# Patient Record
Sex: Female | Born: 2000 | Race: White | Hispanic: No | Marital: Single | Smoking: Never smoker
Health system: Southern US, Community
[De-identification: ages and names within clinical notes are randomized; demographics above are authoritative.]

## PROBLEM LIST (undated history)

## (undated) DIAGNOSIS — G43909 Migraine, unspecified, not intractable, without status migrainosus: Secondary | ICD-10-CM

## (undated) DIAGNOSIS — J45909 Unspecified asthma, uncomplicated: Secondary | ICD-10-CM

## (undated) HISTORY — PX: SHOULDER ARTHROSCOPY: SHX128

---

## 2019-07-27 ENCOUNTER — Telehealth
Admit: 2019-07-27 | Payer: PRIVATE HEALTH INSURANCE | Attending: Obstetrics and Gynecology | Primary: Adolescent Medicine

## 2019-07-27 MED ORDER — NORETHINDRONE (CONTRACEPTIVE) 0.35 MG TABLET
0.35 mg | ORAL_TABLET | Freq: Every day | ORAL | 3 refills | Status: AC
Start: 2019-07-27 — End: 2020-02-14

## 2019-07-27 NOTE — Telephone Encounter
BLMMargaret called stating her Sylnd is no longer covered by her insurance.  She states she called her insurance company and they did not have anything comparable.  She is requesting an alternative.  Pharmacy verified.[facility] [phone]

## 2019-07-27 NOTE — Telephone Encounter
Leah Lyons - please call her; micronor is close.  Have her try that.  I sent rx.

## 2019-07-28 NOTE — Telephone Encounter
Pt advised.

## 2019-12-24 ENCOUNTER — Telehealth: Admit: 2019-12-24 | Payer: PRIVATE HEALTH INSURANCE | Attending: Family Medicine | Primary: Adolescent Medicine

## 2019-12-24 NOTE — Telephone Encounter
Patient called in requesting a appointment with Dr. Juliet Rude.Offered patient the next available face to face appointment of 9/8 and tele health appointment of 9/10 in which patient declined both.Patient stated she needs a appointment before she goes back to school on 8/10.Patient # [phone]

## 2019-12-28 NOTE — Telephone Encounter
Left message for patient to call back and schedule.Please schedule patient in sooner appointment with Sharen Counter per [employer] message.

## 2020-01-05 ENCOUNTER — Encounter: Admit: 2020-01-05 | Payer: BLUE CROSS/BLUE SHIELD | Attending: Family Medicine | Primary: Adolescent Medicine

## 2020-01-06 ENCOUNTER — Telehealth
Admit: 2020-01-06 | Payer: PRIVATE HEALTH INSURANCE | Attending: Obstetrics and Gynecology | Primary: Adolescent Medicine

## 2020-01-06 ENCOUNTER — Encounter: Admit: 2020-01-06 | Payer: BLUE CROSS/BLUE SHIELD | Attending: Family Medicine | Primary: Adolescent Medicine

## 2020-01-06 DIAGNOSIS — G43009 Migraine without aura, not intractable, without status migrainosus: Secondary | ICD-10-CM

## 2020-01-06 MED ORDER — LAMOTRIGINE IMMEDIATE RELEASE 100 MG TABLET
100 mg | Freq: Every day | ORAL | Status: AC
Start: 2020-01-06 — End: 2021-10-05

## 2020-01-06 MED ORDER — NERIVIO DEVICE
13 refills | Status: AC
Start: 2020-01-06 — End: ?

## 2020-01-06 MED ORDER — DIVALPROEX 500 MG TABLET,DELAYED RELEASE
500 mg | Freq: Three times a day (TID) | ORAL | Status: AC
Start: 2020-01-06 — End: 2020-06-05

## 2020-01-06 MED ORDER — DIVALPROEX 250 MG TABLET,DELAYED RELEASE
250 mg | Freq: Three times a day (TID) | ORAL | Status: AC
Start: 2020-01-06 — End: 2020-06-05

## 2020-01-06 MED ORDER — GALCANEZUMAB-GNLM 120 MG/ML SUBCUTANEOUS PEN INJECTOR
120 mg/mL | SUBCUTANEOUS | 11 refills | Status: AC
Start: 2020-01-06 — End: 2021-10-05

## 2020-01-06 MED ORDER — GALCANEZUMAB-GNLM 120 MG/ML SUBCUTANEOUS PEN INJECTOR
120 mg/mL | Freq: Once | SUBCUTANEOUS | 1 refills | Status: AC
Start: 2020-01-06 — End: ?

## 2020-01-06 MED ORDER — ONDANSETRON HCL 4 MG TABLET
4 mg | ORAL_TABLET | Freq: Three times a day (TID) | ORAL | 6 refills | Status: AC | PRN
Start: 2020-01-06 — End: ?

## 2020-01-06 NOTE — Telephone Encounter
BLM,Leah Lyons is calling because the past couple of months she has had migraines near her period, they are worse and lasting longer than in the past. She can be reached at [phone]

## 2020-01-06 NOTE — Progress Notes
The following is the transcribed Review of Systems, done by the patient at Intake, and attached to this is the attending physician's documentation.Review of Systems Constitutional: Negative.  HENT: Negative.  Eyes: Negative.  Respiratory: Negative.  Cardiovascular: Negative.  Gastrointestinal: Negative.  Genitourinary: Negative.  Musculoskeletal: Negative.  Skin: Negative.  Neurological: Positive for dizziness and headaches. Endo/Heme/Allergies: Negative.  Psychiatric/Behavioral: The patient is nervous/anxious.

## 2020-01-06 NOTE — Progress Notes
VIDEO TELEHEALTH VISIT: This clinician is part of the telehealth program and is conducting this visit in a currently approved location. For this visit the clinician and patient were present via interactive audio & video telecommunications system that permits real-time communications.Consent was signed via the Patient Acknowledgement and Financial Authorization Form and the Ambulatory Telehealth Consent Form. Patient is located in [place] state.The clinician is appropriately licensed in the above state to provide care for this visit. Other individuals present during the telehealth encounter and their role/relation: noneIf billing based on time, please complete (Not required if billing based on MDM):                           Total time spent in medical video consultation: 28 Total time spent by the provider on the day of service, which includes time spent on chart review, medical video consultation, education, coordination of care/services and counseling 45 Because this visit was completed over video, a hands-on physical exam was not performed.  Patient/parent or guardian understands and knows to call back if condition changes.Interval Hx: ?Leah Lyons is a pleasant 19 year old woman who presents today for a follow up of headaches. She was last seen on 06/04/2019 under the care of Dr. Juliet Lyons. She recently presented to the [facility] ED on 12/19/2019 for intractable headache.?Today, she states her headaches have gotten much worse since her last visit. She has had 4 severe migraines in the last month, one prompting her to seek care in the emergency department. She states earlier this month she was started on Depakote 750mg  by her psychiatrist for bipolar disorder. She also notes she started a new OCP in January 2021 (northedrone) and has noticed she is now having periods twice a month. She endorses not having a full blown period but has spotting with all associated menstrual cycle symptoms two times per month.  Leah Lyons states her parents want her to have a thyroid panel and hormone panel completed. Her mother has thyroid disease (she is unsure what kind) and thyroid issues run in her family. She reports being more fatigued than usual lately and is concerned regarding thyroid function.She is using Migranal spray at onset of headache with relief obtained quickly, but only lasting 30 minutes before headache returns more severely. She additionally rotates using Tylenol and Advil nothing really knocks it out. She also notes she is now having vertigo and increased nausea with headaches. She has tried Reglan previously with little relief and states Zofran relieves nausea more effectively for her.Leah Lyons is no longer using Emgality, she feels it did not work but also states she forgot about it when going to school and only used it for possibly 2 months total. She last injected this medication about one year ago. She is using Magnesium and Riboflavin and is taking 250mg  of each.Past failed meds: Sumatriptan,  Nortriptyline, Amitriptyline, Gabapentin, Propranolol, ??Current Headache Medications:?Preventives:-Emgality 120mg ?Abortives:-Migranal 4mg  NS?   Allergies Allergen Reactions ? Latex Rash ???     Current Outpatient Medications on File Prior to Visit Medication Sig Dispense Refill ? dihydroergotamine (MIGRANAL) 0.5 mg/pump act. (4 mg/mL) nasal spray Use 1 spray in each nostril as needed for migraine. Use in one nostril as directed.  No more than 4 sprays in one hour 8 mL 12 ? galcanezumab-gnlm (EMGALITY) 120 mg/mL syringe Inject 1 mL (120 mg total) under the skin every 28 days. 1 mL 10 ? norethindrone (MICRONOR) 0.35 mg tablet Take 1 tablet (0.35 mg total) by mouth  daily. 84 tablet 2 ? sertraline (ZOLOFT) 100 mg tablet Take 100 mg by mouth daily. ? ? ?No current facility-administered medications on file prior to visit.  ?Past Medical History    Past Medical History: Diagnosis Date ? Acne ? ? Asthma ? ? Headache ? ? Migraines ?  ???PHYSICAL EXAM:No physical exam was performed today due to video nature of this encounter. ?Wt Readings from Last 3 Encounters: 05/25/19 65.8 kg (76 %, Z= 0.70)* 12/31/18 65.3 kg (76 %, Z= 0.70)* 12/15/18 65.8 kg (77 %, Z= 0.74)* * Growth percentiles are based on CDC (Girls, 2-20 Years) data. Temp Readings from Last 3 Encounters: 12/31/18 98.7 ?F (37.1 ?C) (Temporal) 03/11/18 98.6 ?F (37 ?C) (Temporal) 10/27/17 97.5 ?F (36.4 ?C) (Temporal) BP Readings from Last 3 Encounters: 12/31/18 110/70 12/15/18 118/84 12/01/18 104/70 Pulse Readings from Last 3 Encounters: 12/15/18 76 12/01/18 64 03/11/18 72 ?NEURO EXAM per video observation:Mental Status:Affect is appropriate, Alert, interactive and attentiveOriented to person, place, time and situationMemory recent and remote -intactLanguage fluent with no dysarthria or aphasiaSpeech is clear, not dyarthric.  Attention span  wnl for ageConcentration wnl for ageVII:symmetric brow and smile, no drooping          Tremor: absentAssessment & Plan:?1. 1. Migraine without aura and without status migrainosus, not intractable   ?PLAN:?1. Restart Emgality - take this for a minimum of 4 months to allow medication to reach full benefit2. Nerivio at onset of headache.3. Increase Magnesium/Riboflavin to a therapeutic dose - 400mg  each. 3. Utilize infusion clinic when needed before proceeding to ED4. Zofran 4mg 5. Folic acid 1,06mcg daily - patient reports being sexually active and is currently taking 75mg  Depakote daily. Patient is a female of child-bearing age. At today's visit she has been counseled on the importance of discontinuing AED (anti-epileptic) medication Depakote under physician or APRN supervision if she has plans for pregnancy. If she should become pregnant while on this medication it may be necessary to stop this medication. She has also been counseled to take 1,052mcg (1mg ) folic acid supplementation daily while taking this medication and has verbalized an understanding. The unintended pregnancy rate for women age 29-44 in the [place] is 45-50% and all women of child-bearing age are encouraged to take folic acid supplementation.6. TSH w/reflex is being ordered today7. Follow up with OBGYN regarding irregular menstrual cycles8. RTC 3 monthsLifestyle modifications:Sleep 8 hours per nightDrink at least 8 glasses of water per dayLimit coffee/caffeine to 1-2 cups per dayEat regular mealsExercise daily ?-Patient has been educated to maintain their headache diary.  (I advised Curelator for Headache, an app designed to identify triggers and premonitory signs of migraine). There is also iHeadache, an iPhone app, available as an online service <www.iheadache.com>; for the Android system, there is Headache tracker Pro and Migraine Buddy). -Patient has been counseled to call with any questions, concerns, or new symptoms that arise before the next scheduled appointment and has verbalized and understanding.-I have reviewed the most common and severe side effects of the above medications and the patient understands and agrees to take them.-Patient has been advised to follow up in 4-6 months.-Patient has been educated on the availability of services provided in the infusion clinic and encouraged to call when in need.-Please keep in mind that preventive medications may take 4-12 weeks to be effective in reducing the frequency of your headaches. -Patient has been counseled to call with any concerns or new symptoms that arise before the next appointment and verbalized an understanding. -If your medication does not seem  to be working or if you are experiencing side effects please call the office Monday through Friday 8 AM to 5PM (calling early in the day is best) and we can discuss.??I spent 45 minutes in this visit with 28  minutes dedicated to face to face patient counseling. ?Sharen Counter, APRN ??

## 2020-01-07 NOTE — Telephone Encounter
Pt calls with concern for ramp up in migraines since she came home from school.Wonders if it is related to menses.  Has been on micronor for ~ 6 months and all was ok, but now has uptick in migraines since she has been home.  Was going to ER 1x/mo but now has been 4x/month.Neuro has her on a new cocktail of meds and seems to be working better as of today, but wants to know if there is a hormonal test she can take to help sort this out.  She does not identify any stressors at home that are different from college.Advised :  Ovaries are likely fairly well suppressed on POP if she is being compliant.  She cannot take estrogen containing pills (aura). Depo provera may supporess even more but it is a IM drug and side effects will have to be tolerated if they occur x 12 wks.Will d/w her neurologist next week and get back to her with a plan.

## 2020-01-10 ENCOUNTER — Encounter: Admit: 2020-01-10 | Payer: PRIVATE HEALTH INSURANCE | Primary: Adolescent Medicine

## 2020-01-10 NOTE — Telephone Encounter
Left message for Dr. Juliet Rude to return the call to discuss

## 2020-01-11 ENCOUNTER — Encounter: Admit: 2020-01-11 | Payer: PRIVATE HEALTH INSURANCE | Attending: Family Medicine | Primary: Adolescent Medicine

## 2020-01-11 NOTE — Telephone Encounter
Spoke to Sterling, she will also try and get a message to neurologist to get in touch with you.nr

## 2020-01-11 NOTE — Telephone Encounter
Leah Lyons,Leah Lyons is following up with you, wondering if you have gotten to speak with her neurologist.  She can be reached at [phone]

## 2020-01-11 NOTE — Telephone Encounter
Tried - he has not returned my call.Pls let her know I am away for the week, but will try email if he does not return call soon.

## 2020-02-09 ENCOUNTER — Encounter
Admit: 2020-02-09 | Payer: PRIVATE HEALTH INSURANCE | Attending: Obstetrics and Gynecology | Primary: Adolescent Medicine

## 2020-02-14 ENCOUNTER — Encounter
Admit: 2020-02-14 | Payer: PRIVATE HEALTH INSURANCE | Attending: Obstetrics and Gynecology | Primary: Adolescent Medicine

## 2020-02-14 DIAGNOSIS — Z3041 Encounter for surveillance of contraceptive pills: Secondary | ICD-10-CM

## 2020-02-14 MED ORDER — NORETHINDRONE (CONTRACEPTIVE) 0.35 MG TABLET
0.35 mg | ORAL_TABLET | Freq: Every day | ORAL | 3 refills | Status: AC
Start: 2020-02-14 — End: 2020-08-22

## 2020-02-14 NOTE — Telephone Encounter
BLM,Pt has annual 03/29/20 and is requesting a refill on her birth control pills.Script set up for your review and pharmacy has been updated.[phone]

## 2020-03-14 ENCOUNTER — Emergency Department (HOSPITAL_BASED_OUTPATIENT_CLINIC_OR_DEPARTMENT_OTHER)
Admission: EM | Admit: 2020-03-14 | Discharge: 2020-03-14 | Disposition: A | Discharge status: Home / Self Care | Payer: BC Managed Care – PPO | Attending: Emergency Medicine | Admitting: Emergency Medicine

## 2020-03-14 ENCOUNTER — Other Ambulatory Visit: Payer: Self-pay

## 2020-03-14 ENCOUNTER — Encounter (HOSPITAL_BASED_OUTPATIENT_CLINIC_OR_DEPARTMENT_OTHER): Payer: Self-pay | Admitting: *Deleted

## 2020-03-14 DIAGNOSIS — J45909 Unspecified asthma, uncomplicated: Secondary | ICD-10-CM | POA: Diagnosis not present

## 2020-03-14 DIAGNOSIS — G43909 Migraine, unspecified, not intractable, without status migrainosus: Secondary | ICD-10-CM

## 2020-03-14 DIAGNOSIS — R110 Nausea: Secondary | ICD-10-CM | POA: Diagnosis present

## 2020-03-14 HISTORY — DX: Unspecified asthma, uncomplicated: J45.909

## 2020-03-14 HISTORY — DX: Migraine, unspecified, not intractable, without status migrainosus: G43.909

## 2020-03-14 LAB — PREGNANCY, URINE: Preg Test, Ur: NEGATIVE

## 2020-03-14 MED ORDER — DIPHENHYDRAMINE HCL 50 MG/ML IJ SOLN
25.0000 mg | Freq: Once | INTRAMUSCULAR | Status: AC
  Administered 2020-03-14: 25 mg via INTRAVENOUS
  Filled 2020-03-14: qty 1

## 2020-03-14 MED ORDER — KETOROLAC TROMETHAMINE 30 MG/ML IJ SOLN
30.0000 mg | Freq: Once | INTRAMUSCULAR | Status: AC
  Administered 2020-03-14: 30 mg via INTRAVENOUS
  Filled 2020-03-14: qty 1

## 2020-03-14 MED ORDER — DEXAMETHASONE SODIUM PHOSPHATE 10 MG/ML IJ SOLN
10.0000 mg | Freq: Once | INTRAMUSCULAR | Status: AC
  Administered 2020-03-14: 10 mg via INTRAVENOUS
  Filled 2020-03-14: qty 1

## 2020-03-14 MED ORDER — PROCHLORPERAZINE EDISYLATE 10 MG/2ML IJ SOLN
10.0000 mg | Freq: Once | INTRAMUSCULAR | Status: AC
  Administered 2020-03-14: 10 mg via INTRAVENOUS
  Filled 2020-03-14: qty 2

## 2020-03-14 MED ORDER — SODIUM CHLORIDE 0.9 % IV BOLUS
1000.0000 mL | Freq: Once | INTRAVENOUS | Status: AC
  Administered 2020-03-14: 1000 mL via INTRAVENOUS

## 2020-03-14 NOTE — Discharge Instructions (Signed)
Follow up with your neurologist  Return for new or worsening symptoms

## 2020-03-14 NOTE — ED Provider Notes (Signed)
[facility] EMERGENCY DEPARTMENT Provider Note   CSN: [id number] Arrival date & time: 03/14/20  1253    History Chief Complaint  Patient presents with  . Migraine    Helen Williams is a 19 y.o. female with past medical history significant for migraines, asthma who presents for evaluation of migraine x 5 days.  States this feels similar to her prior migraines.  Rates her pain a 9/10.  States [place] neurology [place] of state.  She is here for college.  States was previously on Depakote for her migraines who was taken off of this 2 months ago.  No recent injury or head trauma.  Currently takes magnesium, Emgality, riboflavin.  Rotates Tylenol, Advil, Tylenol for migraine.  Has intermittent dizziness, nausea, photophobia with headaches.  States migraine cocktails "do not really work."  Patient states her pain will go away however will have rebound headache for the next 3 days.  Patient has failed sumatriptan, nortriptyline, amitriptyline, gabapentin, propranolol.  Tried Migranal, 4 mg NS without relief of her headache.  Headache frontal in nature.  No sudden onset thunderclap headache.  Intermittent bilateral blurry vision however no vision changes currently.  No weakness.  No unilateral paresthesias.  Patient states " I feel 3 clicks behind everything."  No facial asymmetry, speech difficulty, diplopia, chest pain, shortness of breath abdominal pain, diarrhea, dysuria, rashes or lesions.  Denies additional aggravating or alleviating factors.  Was recently diagnosis with sinus infection at school. Had negative COVID test. Currently on Abx.  History obtained from patient and past medical records.  No interpreter used.  HPI     Past Medical History:  Diagnosis Date  . Asthma   . Migraines     There are no problems to display for this patient.   Past Surgical History:  Procedure Laterality Date  . SHOULDER ARTHROSCOPY       OB History   No obstetric history on  file.     History reviewed. No pertinent family history.  Social History   Tobacco Use  . Smoking status: Never Smoker  . Smokeless tobacco: Never Used  Vaping Use  . Vaping Use: Some days  Substance Use Topics  . Alcohol use: Yes  . Drug use: Yes    Types: Marijuana    Home Medications Prior to Admission medications   Not on File    Allergies    Latex  Review of Systems   Review of Systems  Constitutional: Negative.   HENT: Negative.   Respiratory: Negative.   Cardiovascular: Negative.   Gastrointestinal: Negative.   Genitourinary: Negative.   Musculoskeletal: Negative.   Skin: Negative.   Neurological: Positive for dizziness (Intermittent) and headaches. Negative for tremors, seizures, syncope, facial asymmetry, speech difficulty, weakness, light-headedness and numbness.  All other systems reviewed and are negative.   Physical Exam Updated Vital Signs BP 113/80 (BP Location: Right Arm)   Pulse (!) 56   Temp 98 F (36.7 C) (Oral)   Resp 18   Ht 5\' 4"  (1.626 m)   Wt 65.8 kg   LMP 02/12/2020   SpO2 99%   BMI 24.89 kg/m   Physical Exam Physical Exam  Constitutional: Pt is oriented to person, place, and time. Pt appears well-developed and well-nourished. No distress.  HENT:  Head: Normocephalic and atraumatic.  Mouth/Throat: Oropharynx is clear and moist.  Eyes: Conjunctivae and EOM are normal. Pupils are equal, round, and reactive to light. No scleral icterus.  No horizontal, vertical or rotational  nystagmus  Neck: Normal range of motion. Neck supple.  Full active and passive ROM without pain No midline or paraspinal tenderness No nuchal rigidity or meningeal signs  Cardiovascular: Normal rate, regular rhythm and intact distal pulses.   Pulmonary/Chest: Effort normal and breath sounds normal. No respiratory distress. Pt has no wheezes. No rales.  Abdominal: Soft. Bowel sounds are normal. There is no tenderness. There is no rebound and no guarding.    Musculoskeletal: Normal range of motion.  Lymphadenopathy:    No cervical adenopathy.  Neurological: Pt. is alert and oriented to person, place, and time. He has normal reflexes. No cranial nerve deficit.  Exhibits normal muscle tone. Coordination normal.  Mental Status:  Alert, oriented, thought content appropriate. Speech fluent without evidence of aphasia. Able to follow 2 step commands without difficulty.  Cranial Nerves:  II:  Peripheral visual fields grossly normal, pupils equal, round, reactive to light III,IV, VI: ptosis not present, extra-ocular motions intact bilaterally  V,VII: smile symmetric, facial light touch sensation equal VIII: hearing grossly normal bilaterally  IX,X: midline uvula rise  XI: bilateral shoulder shrug equal and strong XII: midline tongue extension  Motor:  5/5 in upper and lower extremities bilaterally including strong and equal grip strength and dorsiflexion/plantar flexion Sensory: Pinprick and light touch normal in all extremities.  Deep Tendon Reflexes: 2+ and symmetric  Cerebellar: normal finger-to-nose with bilateral upper extremities Gait: normal gait and balance CV: distal pulses palpable throughout   Skin: Skin is warm and dry. No rash noted. Pt is not diaphoretic.  Psychiatric: Pt has a normal mood and affect. Behavior is normal. Judgment and thought content normal.  Nursing note and vitals reviewed. ED Results / Procedures / Treatments   Labs (all labs ordered are listed, but only abnormal results are displayed) Labs Reviewed  PREGNANCY, URINE    EKG None  Radiology No results found.  Procedures Procedures (including critical care time)  Medications Ordered in ED Medications  ketorolac (TORADOL) 30 MG/ML injection 30 mg (30 mg Intravenous Given 03/14/20 1402)  prochlorperazine (COMPAZINE) injection 10 mg (10 mg Intravenous Given 03/14/20 1357)  diphenhydrAMINE (BENADRYL) injection 25 mg (25 mg Intravenous Given 03/14/20 1359)   dexamethasone (DECADRON) injection 10 mg (10 mg Intravenous Given 03/14/20 1402)  sodium chloride 0.9 % bolus 1,000 mL (1,000 mLs Intravenous New Bag/Given 03/14/20 1356)   ED Course  I have reviewed the triage vital signs and the nursing notes.  Pertinent labs & imaging results that were available during my care of the patient were reviewed by me and considered in my medical decision making (see chart for details).  19 year old with extensive history for migraines, followed by neurology at [facility] presents for evaluation of headache.  Headache x5 days.  She is afebrile, nonseptic, not ill-appearing.  No recent injury or trauma.  She has nonfocal neuro exam without deficits.  Plan pregnancy test, migraine cocktail and reassess.  Patient reassessed.  States headache has significantly improved.  Now rated a 1/10.  She continues have nonfocal neuro exam without deficits.  Symptoms likely patient's recurrent migraine.  I encouraged to follow-up with her neurologist.  She'll return for any worsening symptoms.  Presentation non concerning for CVA, dissection, central cause of HA, SAH, ICH, Meningitis, or temporal arteritis. Pt is afebrile with no focal neuro deficits, nuchal rigidity, or change in vision.   The patient has been appropriately medically screened and/or stabilized in the ED. I have low suspicion for any other  emergent medical condition which would require further screening, evaluation or treatment in the ED or require inpatient management.  Patient is hemodynamically stable and in no acute distress.  Patient able to ambulate in department prior to ED.  Evaluation does not show acute pathology that would require ongoing or additional emergent interventions while in the emergency department or further inpatient treatment.  I have discussed the diagnosis with the patient and answered all questions.  Pain is been managed while in the emergency department and patient has no  further complaints prior to discharge.  Patient is comfortable with plan discussed in room and is stable for discharge at this time.  I have discussed strict return precautions for returning to the emergency department.  Patient was encouraged to follow-up with PCP/specialist refer to at discharge.    MDM Rules/Calculators/A&P                           Final Clinical Impression(s) / ED Diagnoses Final diagnoses:  Migraine without status migrainosus, not intractable, unspecified migraine type    Rx / DC Orders ED Discharge Orders    None       Toluwani Yadav A, PA-C 03/14/20 1503    Virgina Norfolk, DO 03/15/20 0156

## 2020-03-14 NOTE — ED Triage Notes (Signed)
Migraine x 5 days. Hx of same.

## 2020-03-29 ENCOUNTER — Encounter: Admit: 2020-03-29 | Payer: BLUE CROSS/BLUE SHIELD | Attending: Obstetrics and Gynecology | Primary: Adolescent Medicine

## 2020-04-06 ENCOUNTER — Encounter: Admit: 2020-04-06 | Payer: BLUE CROSS/BLUE SHIELD | Attending: Family Medicine | Primary: Adolescent Medicine

## 2020-04-06 ENCOUNTER — Encounter: Admit: 2020-04-06 | Payer: PRIVATE HEALTH INSURANCE | Attending: Family Medicine | Primary: Adolescent Medicine

## 2020-04-06 DIAGNOSIS — G43109 Migraine with aura, not intractable, without status migrainosus: Secondary | ICD-10-CM

## 2020-04-06 DIAGNOSIS — J45909 Unspecified asthma, uncomplicated: Secondary | ICD-10-CM

## 2020-04-06 DIAGNOSIS — G43909 Migraine, unspecified, not intractable, without status migrainosus: Secondary | ICD-10-CM

## 2020-04-06 DIAGNOSIS — R519 Headache: Secondary | ICD-10-CM

## 2020-04-06 DIAGNOSIS — L709 Acne, unspecified: Secondary | ICD-10-CM

## 2020-04-07 NOTE — Progress Notes
VIDEO TELEHEALTH VISIT: This clinician is part of the telehealth program and is conducting this visit in a currently approved location. For this visit the clinician and patient were present via interactive audio & video telecommunications system that permits real-time communications. Consent was signed via the Patient Acknowledgement and Financial Authorization Form and the Ambulatory Telehealth Consent Form. Patient is located in [place] state state today.The clinician is appropriately licensed in the above state to provide care for this visit per current state telehealth guidelines at the time of this visit. Other individuals present during the telehealth encounter and their role/relation: noneIf billing based on time, please complete (Not required if billing based on MDM):                           Total time spent in medical video consultation: Total time spent by the provider on the day of service, which includes time spent on chart review, medical video consultation, education, coordination of care/services and counseling 21 minutes Because this visit was completed over video, a hands-on physical exam was not performed.  Patient/parent or guardian understands and knows to call back if condition changes. Interval Hx: Leah Lyons is a pleasant 19 year old woman who presents today for a follow up of headaches. She was last seen on 01/06/2020. Today, she states headaches are the same. She recently noticed high elevation exacerbates headaches when she was in [place], which messes with my head. Leah Lyons states her twin brother gets complex migraines with numbness and that she has started to develop these as well. She reports her last two headaches have been severe, one provoking an ED visit last month. She states she is now experiencing facial numbness and tingling on one side of her body, she is unsure if it's the same side each time but feels it may be the left side. She is also experiencing balance issues which provokes nausea. These associated symptoms may last 2-3 days after migraine subsides.She reports decreased headache activity recently, with no severe headaches in the month of October and only one week of mild headache this month. Typically, she may experience mild headache 50% of the month. She is using Advil or Excedrin migraine at onset of headache. Previous DHE use made headaches worse in her opinion.Leah Lyons believes she may have had MRA 4 years ago but no imaging is on record. She is currently away at school in [place] where she spends the majority of her time.Current Headache Medications:Preventives:Depakote - no longer taking LamictalEmgality MagnesiumRiboflavinAbortives:DHE sprayNerivio - has not received yetZofranAllergies Allergen Reactions ? Latex Rash Current Outpatient Medications on File Prior to Visit Medication Sig Dispense Refill ? dihydroergotamine (MIGRANAL) 0.5 mg/pump act. (4 mg/mL) nasal spray Use 1 spray in each nostril as needed for migraine. Use in one nostril as directed.  No more than 4 sprays in one hour 8 mL 12 ? divalproex (DEPAKOTE DR) 250 mg delayed release tablet Take 250 mg by mouth 3 (three) times daily.   ? divalproex (DEPAKOTE DR) 500 mg delayed release tablet Take 500 mg by mouth 3 (three) times daily.   ? galcanezumab-gnlm (EMGALITY) 120 mg/mL pen injector Inject 1 mL (120 mg total) under the skin every 28 days. 1 mL 10 ? lamoTRIgine (LAMICTAL) 100 mg immediate release tablet Take 100 mg by mouth daily.   ? Nerivio device Start within 60 mins of migraine onset. Set a strong, yet comfortable intensity level in the first few mins. Maintain that level  for 45 mins. 1 each 12 ? norethindrone (MICRONOR) 0.35 mg tablet Take 1 tablet (0.35 mg total) by mouth daily. 84 tablet 2 ? ondansetron (ZOFRAN) 4 mg tablet Take 1 tablet (4 mg total) by mouth every 8 (eight) hours as needed for nausea (associated with migraine). 20 tablet 5 ? sertraline (ZOLOFT) 100 mg tablet Take 100 mg by mouth daily.   No current facility-administered medications on file prior to visit. Past Medical History: Diagnosis Date ? Acne  ? Asthma  ? Headache  ? Migraines  PHYSICAL EXAM:No vitals were taken today due to the video nature of this encounter.Wt Readings from Last 3 Encounters: 01/06/20 65.8 kg 05/25/19 65.8 kg (76 %, Z= 0.70)* 12/31/18 65.3 kg (76 %, Z= 0.70)* * Growth percentiles are based on CDC (Girls, 2-20 Years) data. Temp Readings from Last 3 Encounters: 12/31/18 98.7 ?F (37.1 ?C) (Temporal) 03/11/18 98.6 ?F (37 ?C) (Temporal) 10/27/17 97.5 ?F (36.4 ?C) (Temporal) BP Readings from Last 3 Encounters: 12/31/18 110/70 12/15/18 118/84 12/01/18 104/70 Pulse Readings from Last 3 Encounters: 12/15/18 76 12/01/18 64 03/11/18 72 NEURO EXAM per video observation:Mental Status:Affect is appropriate, Alert, interactive and attentiveOriented to person, place, time and situationMemory recent and remote -intactLanguage fluent with no dysarthria or aphasiaSpeech is clear, not dyarthric.  Attention span  wnl for ageConcentration wnl for ageCranial Nerves: IEP:PIRJJOACZ brow and smile, no droopingVIII: gross hearing intactTremor: absentAssessment & Plan:1. 1. Migraine with aura and without status migrainosus, not intractable   PLAN:Today I strongly recommend she locate a local neurologist at her earliest convenience. I advised obtaining an MRI would be reasonable due to new onset complex symptoms. We also discussed the possibility of Ubrelvy or Nurtec at onset of headache. She will need a local provider to obtain these prescriptions in [place]. Lifestyle modifications:Sleep 8 hours per nightDrink at least 8 glasses of water per dayLimit coffee/caffeine to 1-2 cups per dayEat regular mealsExercise daily -Patient has been educated to maintain their headache diary.  (I advised Curelator for Headache, an app designed to identify triggers and premonitory signs of migraine). There is also iHeadache, an iPhone app, available as an online service <www.iheadache.com>; for the Android system, there is Headache tracker Pro and Migraine Buddy). -Patient has been counseled to call with any questions, concerns, or new symptoms that arise before the next scheduled appointment and has verbalized and understanding.-I have reviewed the most common and severe side effects of the above medications and the patient understands and agrees to take them.-Patient has been advised to follow up in 4-6 months.-Patient has been educated on the availability of services provided in the infusion clinic and encouraged to call when in need.-Please keep in mind that preventive medications may take 4-12 weeks to be effective in reducing the frequency of your headaches. -If your medication does not seem to be working or if you are experiencing side effects please call the office Monday through Friday 8 AM to 5PM (calling early in the day is best) and we can discuss.I spent 21 minutes in this visit with 15 minutes dedicated to virtual patient counseling. Sharen Counter, APRN

## 2020-04-07 NOTE — Progress Notes
The following is the Review of systems done at intake and attached to this is the attending physician's documentation.Review of Systems HENT: Negative.  Respiratory: Negative.  Cardiovascular: Negative.  Gastrointestinal: Negative.  Endocrine: Negative.  Genitourinary: Negative.  Neurological: Positive for headaches.      Migraines  Hematological: Negative.  Psychiatric/Behavioral: Negative.

## 2020-04-24 ENCOUNTER — Encounter: Admit: 2020-04-24 | Payer: PRIVATE HEALTH INSURANCE | Attending: Family Medicine | Primary: Adolescent Medicine

## 2020-04-25 ENCOUNTER — Telehealth: Admit: 2020-04-25 | Payer: PRIVATE HEALTH INSURANCE | Attending: Family Medicine | Primary: Adolescent Medicine

## 2020-04-25 DIAGNOSIS — G43709 Chronic migraine without aura, not intractable, without status migrainosus: Secondary | ICD-10-CM

## 2020-04-25 NOTE — Telephone Encounter
[facility] MESSAGETime of call:   2:37 PMCaller:   StacyCaller's relationship to patient:  stepmother  Calling from [employer], hospital, agency, etc.):  n/a   Reason for call:   Patients stepmother called in and states Keya is away at college and she is in pretty bad shape with the migrains. Kennyth Arnold is requesting a callback from [employer]. If not feeling well, what are symptoms:  n/a   If having symptoms, how long have the symptoms been present:  n/a   Does caller request to speak to someone urgently?  yes   Best telephone number for callback:   [phone] time to return call:   anytime Permission to leave message:  yes   [facility] Referral Specialist

## 2020-04-26 ENCOUNTER — Emergency Department (HOSPITAL_BASED_OUTPATIENT_CLINIC_OR_DEPARTMENT_OTHER)
Admission: EM | Admit: 2020-04-26 | Discharge: 2020-04-26 | Disposition: A | Discharge status: Home / Self Care | Payer: BC Managed Care – PPO | Attending: Emergency Medicine | Admitting: Emergency Medicine

## 2020-04-26 ENCOUNTER — Other Ambulatory Visit: Payer: Self-pay

## 2020-04-26 ENCOUNTER — Encounter (HOSPITAL_BASED_OUTPATIENT_CLINIC_OR_DEPARTMENT_OTHER): Payer: Self-pay | Admitting: *Deleted

## 2020-04-26 DIAGNOSIS — G43909 Migraine, unspecified, not intractable, without status migrainosus: Secondary | ICD-10-CM | POA: Diagnosis present

## 2020-04-26 DIAGNOSIS — J45909 Unspecified asthma, uncomplicated: Secondary | ICD-10-CM | POA: Diagnosis not present

## 2020-04-26 DIAGNOSIS — Z91040 Latex allergy status: Secondary | ICD-10-CM | POA: Insufficient documentation

## 2020-04-26 DIAGNOSIS — G43801 Other migraine, not intractable, with status migrainosus: Secondary | ICD-10-CM | POA: Insufficient documentation

## 2020-04-26 DIAGNOSIS — Z20822 Contact with and (suspected) exposure to covid-19: Secondary | ICD-10-CM | POA: Diagnosis not present

## 2020-04-26 LAB — CBC WITH DIFFERENTIAL/PLATELET
Abs Immature Granulocytes: 0.03 10*3/uL (ref 0.00–0.07)
Basophils Absolute: 0 10*3/uL (ref 0.0–0.1)
Basophils Relative: 1 %
Eosinophils Absolute: 0 10*3/uL (ref 0.0–0.5)
Eosinophils Relative: 1 %
HCT: 42.7 % (ref 36.0–46.0)
Hemoglobin: 14.2 g/dL (ref 12.0–15.0)
Immature Granulocytes: 0 %
Lymphocytes Relative: 21 %
Lymphs Abs: 1.7 10*3/uL (ref 0.7–4.0)
MCH: 30 pg (ref 26.0–34.0)
MCHC: 33.3 g/dL (ref 30.0–36.0)
MCV: 90.1 fL (ref 80.0–100.0)
Monocytes Absolute: 0.5 10*3/uL (ref 0.1–1.0)
Monocytes Relative: 6 %
Neutro Abs: 5.9 10*3/uL (ref 1.7–7.7)
Neutrophils Relative %: 71 %
Platelets: 147 10*3/uL — ABNORMAL LOW (ref 150–400)
RBC: 4.74 MIL/uL (ref 3.87–5.11)
RDW: 11.4 % — ABNORMAL LOW (ref 11.5–15.5)
WBC: 8.1 10*3/uL (ref 4.0–10.5)
nRBC: 0 % (ref 0.0–0.2)

## 2020-04-26 LAB — RESPIRATORY PANEL BY RT PCR (FLU A&B, COVID)
Influenza A by PCR: NEGATIVE
Influenza B by PCR: NEGATIVE
SARS Coronavirus 2 by RT PCR: NEGATIVE

## 2020-04-26 LAB — BASIC METABOLIC PANEL
Anion gap: 11 (ref 5–15)
BUN: 16 mg/dL (ref 6–20)
CO2: 26 mmol/L (ref 22–32)
Calcium: 9.6 mg/dL (ref 8.9–10.3)
Chloride: 102 mmol/L (ref 98–111)
Creatinine, Ser: 0.87 mg/dL (ref 0.44–1.00)
GFR, Estimated: >60 mL/min (ref >60)
Glucose, Bld: 98 mg/dL (ref 70–99)
Potassium: 4.3 mmol/L (ref 3.5–5.1)
Sodium: 139 mmol/L (ref 135–145)

## 2020-04-26 MED ORDER — PROCHLORPERAZINE EDISYLATE 10 MG/2ML IJ SOLN
10.0000 mg | Freq: Once | INTRAMUSCULAR | Status: AC
  Administered 2020-04-26: 10 mg via INTRAVENOUS
  Filled 2020-04-26: qty 2

## 2020-04-26 MED ORDER — KETOROLAC TROMETHAMINE 15 MG/ML IJ SOLN
15.0000 mg | Freq: Once | INTRAMUSCULAR | Status: AC
  Administered 2020-04-26: 15 mg via INTRAVENOUS
  Filled 2020-04-26: qty 1

## 2020-04-26 MED ORDER — DEXAMETHASONE SODIUM PHOSPHATE 10 MG/ML IJ SOLN
10.0000 mg | Freq: Once | INTRAMUSCULAR | Status: AC
  Administered 2020-04-26: 10 mg via INTRAVENOUS
  Filled 2020-04-26: qty 1

## 2020-04-26 MED ORDER — SODIUM CHLORIDE 0.9 % IV BOLUS
1000.0000 mL | Freq: Once | INTRAVENOUS | Status: AC
  Administered 2020-04-26: 1000 mL via INTRAVENOUS

## 2020-04-26 MED ORDER — DIPHENHYDRAMINE HCL 50 MG/ML IJ SOLN
25.0000 mg | Freq: Once | INTRAMUSCULAR | Status: AC
  Administered 2020-04-26: 25 mg via INTRAVENOUS
  Filled 2020-04-26: qty 1

## 2020-04-26 NOTE — ED Notes (Signed)
Pt refused gown-states she will take her arm out of her sleeve and that she is here for the same every month

## 2020-04-26 NOTE — ED Provider Notes (Signed)
[facility] EMERGENCY DEPARTMENT Provider Note   CSN: [id number] Arrival date & time: 04/26/20  1117     History Chief Complaint  Patient presents with  . Migraine    Helen Williams is a 19 y.o. female.  HPI Helen Williams is a 19 y.o. female with past medical history significant for migraines and asthma who presents for evaluation of migraine x 4 days.  States this feels similar to her prior migraines.  States she is followed by neurology out of state but is now working to find a neurologist locally.  She is here for college.  States was previously on Depakote for her migraines who was taken off of this 3 months ago.  No recent injury or head trauma.  Currently takes magnesium, Emgality, riboflavin, as well as wellbutrin and lamotrigine.  Rotates Tylenol, Advil, Tylenol for migraine.  Has intermittent dizziness, nausea, photophobia with headaches.  States she also experiences numbness and tingling at the onset of her sx but that this alleviates and denies any currently. Patient has failed sumatriptan, nortriptyline, amitriptyline, gabapentin, propranolol. Headache frontal in nature.  No sudden onset thunderclap headache.  No weakness.  No facial asymmetry, speech difficulty, diplopia, phonophobia, chest pain, shortness of breath, abdominal pain, diarrhea, dysuria, rashes or lesions.    Pt also requests a COVID test. She is experiencing mild rhinorrhea but no other URI complaints. History of seasonal allergies.  No cough, CP, SOB, sore throat, ear pain. Her roommate recently tested positive for COVID. She is fully vaccinated.      Past Medical History:  Diagnosis Date  . Asthma   . Migraines     There are no problems to display for this patient.   Past Surgical History:  Procedure Laterality Date  . SHOULDER ARTHROSCOPY       OB History   No obstetric history on file.     History reviewed. No pertinent family history.  Social History   Tobacco Use  .  Smoking status: Never Smoker  . Smokeless tobacco: Never Used  Vaping Use  . Vaping Use: Some days  Substance Use Topics  . Alcohol use: Yes  . Drug use: Yes    Types: Marijuana    Home Medications Prior to Admission medications   Medication Sig Start Date End Date Taking? Authorizing Provider  Galcanezumab-gnlm (EMGALITY) 120 MG/ML SOAJ INJECT 1 ML (120 MG TOTAL) UNDER THE SKIN EVERY 28 DAYS. 01/06/20  Yes [provider]  albuterol (VENTOLIN HFA) 108 (90 Base) MCG/ACT inhaler Inhale 2 puffs into the lungs every 4 (four) hours as needed. 01/18/20   [provider]  buPROPion (WELLBUTRIN XL) 150 MG 24 hr tablet Take 150 mg by mouth at bedtime. 03/22/20   [provider]  lamoTRIgine (LAMICTAL) 200 MG tablet Take 200 mg by mouth daily. 04/08/20   [provider]  norethindrone (MICRONOR) 0.35 MG tablet Take 1 tablet by mouth daily. 04/17/20   [provider]    Allergies    Latex  Review of Systems   Review of Systems  All other systems reviewed and are negative. Ten systems reviewed and are negative for acute change, except as noted in the HPI.    Physical Exam Updated Vital Signs BP 106/70   Pulse 70   Temp 98.4 F (36.9 C) (Oral)   Resp 16   Ht 5\' 4"  (1.626 m)   Wt 65.8 kg   LMP 04/19/2020   SpO2 98%   BMI 24.89 kg/m  Physical Exam Vitals and nursing note reviewed.  Constitutional:      General: She is not in acute distress.    Appearance: Normal appearance. She is not ill-appearing, toxic-appearing or diaphoretic.  HENT:     Head: Normocephalic and atraumatic.     Right Ear: External ear normal.     Left Ear: External ear normal.     Nose: Nose normal.     Mouth/Throat:     Mouth: Mucous membranes are moist.     Pharynx: Oropharynx is clear. No oropharyngeal exudate or posterior oropharyngeal erythema.  Eyes:     General: No scleral icterus.       Right eye: No discharge.        Left eye: No discharge.      Extraocular Movements: Extraocular movements intact.     Conjunctiva/sclera: Conjunctivae normal.     Pupils: Pupils are equal, round, and reactive to light.     Comments: PERRL. EOMI.  Cardiovascular:     Rate and Rhythm: Normal rate.     Pulses: Normal pulses.  Pulmonary:     Effort: Pulmonary effort is normal.  Abdominal:     General: Abdomen is flat.     Tenderness: There is no abdominal tenderness.  Musculoskeletal:        General: Normal range of motion.     Cervical back: Normal range of motion and neck supple. No tenderness.  Skin:    General: Skin is warm and dry.  Neurological:     General: No focal deficit present.     Mental Status: She is alert and oriented to person, place, and time.     Comments: Patient is oriented to person, place, and time. Patient phonates in clear, complete, and coherent sentences. Negative arm drift. Strength is 5/5 in all four extremities. Distal sensation intact in all four extremities.  Psychiatric:        Mood and Affect: Mood normal.        Behavior: Behavior normal.    ED Results / Procedures / Treatments   Labs (all labs ordered are listed, but only abnormal results are displayed) Labs Reviewed  CBC WITH DIFFERENTIAL/PLATELET - Abnormal; Notable for the following components:      Result Value   RDW 11.4 (*)    Platelets 147 (*)    All other components within normal limits  RESPIRATORY PANEL BY RT PCR (FLU A&B, COVID)  BASIC METABOLIC PANEL   EKG None  Radiology No results found.  Procedures Procedures (including critical care time)  Medications Ordered in ED Medications  dexamethasone (DECADRON) injection 10 mg (has no administration in time range)  prochlorperazine (COMPAZINE) injection 10 mg (10 mg Intravenous Given 04/26/20 1345)  diphenhydrAMINE (BENADRYL) injection 25 mg (25 mg Intravenous Given 04/26/20 1344)  sodium chloride 0.9 % bolus 1,000 mL (0 mLs Intravenous Stopped 04/26/20 1440)  ketorolac (TORADOL) 15  MG/ML injection 15 mg (15 mg Intravenous Given 04/26/20 1424)   ED Course  I have reviewed the triage vital signs and the nursing notes.  Pertinent labs & imaging results that were available during my care of the patient were reviewed by me and considered in my medical decision making (see chart for details).    MDM Rules/Calculators/A&P                          Patient is a 19 year old female that presents to the emergency department due to a migraine headache that  started a few days ago.   Patient has a lengthy history of migraine headaches and has been evaluated for these in the past.  Her symptoms today are consistent with prior migraines.  Her neurological exam is benign. No red flags. Given her history of sx did not feel that new imaging was warranted and pt is amenable.   I obtained basic labs and gave patient a migraine cocktail.  She notes almost complete resolution of her symptoms.  Basic labs are reassuring.  COVID-19 test in process.  Patient was given a dose of Decadron to prevent rebound sx.  She notes that she is working with [facility] to find a new neurologist in the area.  Urged her to return to the emergency department with any new or worsening symptoms.  Her questions were answered and she was amicable at the time of discharge.  Her vital signs are stable.  Final Clinical Impression(s) / ED Diagnoses Final diagnoses:  Other migraine with status migrainosus, not intractable   Rx / DC Orders ED Discharge Orders    None       Placido Sou, PA-C 04/26/20 1515    Little, Ambrose Finland, MD 04/27/20 [phone]

## 2020-04-26 NOTE — ED Notes (Signed)
Review D/C papers with pt, pt states understanding, pt denies questions at this time. 

## 2020-04-26 NOTE — ED Triage Notes (Signed)
Headache since Sunday.  Has history of migraine.

## 2020-04-26 NOTE — Telephone Encounter
Leah Lyons step mother of Leah Lyons calling to f/u on yesterdays encounter.She states th patient is away at college and has had a migraine for the past 5-6 days.She states the patients pain is severe 10+ with N&V,+light and sound sensitivityShe states the pt took Emgality last week and has been taking Excedrin with no relief.She would like a call back to discuss,she wants to be on the call if the patient is called back.Leah Lyons is will not be available from 11:30-1 and 2-3.Leah Lyons : [phone] route to nurse pool.

## 2020-04-27 ENCOUNTER — Encounter: Admit: 2020-04-27 | Payer: PRIVATE HEALTH INSURANCE | Attending: Family Medicine | Primary: Adolescent Medicine

## 2020-04-27 NOTE — Telephone Encounter
Left voice message for patient to call office. 

## 2020-04-27 NOTE — Telephone Encounter
Spoke with patient.Patient is complaining of increase in migraine frequency for last month.Says she usually has one migraine per month but is now experiencing two per month.Patient is currently taking Lamictal 100 mg daily, Emgality 120 mg monthly, Magnesium and Riboflavin.She is no longer using DHE as needed.It made my migraine worst.Patient says she takes excedrin migraine with minimal relief.She has not been able to obtain nerivio since she is in [place].Patient is also asking for referral to be seen at [facility].

## 2020-05-10 ENCOUNTER — Encounter: Admit: 2020-05-10 | Payer: BLUE CROSS/BLUE SHIELD | Attending: Obstetrics and Gynecology | Primary: Adolescent Medicine

## 2020-05-18 ENCOUNTER — Encounter: Admit: 2020-05-18 | Payer: PRIVATE HEALTH INSURANCE | Attending: Family Medicine | Primary: Adolescent Medicine

## 2020-06-02 ENCOUNTER — Encounter: Admit: 2020-06-02 | Payer: BLUE CROSS/BLUE SHIELD | Attending: Obstetrics and Gynecology | Primary: Adolescent Medicine

## 2020-06-05 ENCOUNTER — Ambulatory Visit: Admit: 2020-06-05 | Payer: BLUE CROSS/BLUE SHIELD | Attending: Obstetrics and Gynecology | Primary: Adolescent Medicine

## 2020-06-05 ENCOUNTER — Encounter
Admit: 2020-06-05 | Payer: PRIVATE HEALTH INSURANCE | Attending: Obstetrics and Gynecology | Primary: Adolescent Medicine

## 2020-06-05 DIAGNOSIS — Z01419 Encounter for gynecological examination (general) (routine) without abnormal findings: Secondary | ICD-10-CM

## 2020-06-05 DIAGNOSIS — L709 Acne, unspecified: Secondary | ICD-10-CM

## 2020-06-05 DIAGNOSIS — R519 Headache: Secondary | ICD-10-CM

## 2020-06-05 DIAGNOSIS — J45909 Unspecified asthma, uncomplicated: Secondary | ICD-10-CM

## 2020-06-05 DIAGNOSIS — G43909 Migraine, unspecified, not intractable, without status migrainosus: Secondary | ICD-10-CM

## 2020-06-05 MED ORDER — BUPROPION HCL SR 150 MG TABLET,12 HR SUSTAINED-RELEASE
150 mg | ORAL | Status: AC
Start: 2020-06-05 — End: 2021-10-05

## 2020-06-05 NOTE — Patient Instructions
Samples of slynd given to start when this pack of micronor is done.  Please discuss this with neurology as well. Would he prefer you to be off the birth control to determine if there is a significant hormonal component to the headaches?

## 2020-06-05 NOTE — Progress Notes
Leah Lyons Leah Lyons is a 19 y.o. G0P0000 female who presents to this practice for an annual exam.Patient's last menstrual period was 05/11/2020 (exact date). Has been having BTB on micronor for last 3-4 cycles in 2nd and in 4th week.  Also notes HA's along with BTB.  Has good compliance with pill.Is on new migraine meds and thinks overall may be slightly better, but wonders how best to handle the ? Hormonal component.Has more cramping last few months - mom has endometriosis so she wonders if this is that.  No pain otherwise, no pain with intercourse.Parents would like all the complete blood panels done - her aunt has a thyroid disorder.  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	Problem List: has Menorrhagia with regular cycle on their problem list. ?	Past Surgical History: has a past surgical history that includes shoulder (Right).?	Past Medical History: has a past medical history of Acne, Asthma, Headache, and Migraines.?	Family History: family history includes Breast cancer in her mother.?	Allergies:is allergic to latex. Medications: Current Outpatient Medications: ?  galcanezumab-gnlm, 120 mg, Subcutaneous, Q28 Days?  lamoTRIgine, 100 mg, Oral, Daily?  Nerivio, Start within 60 mins of migraine onset. Set a strong, yet comfortable intensity level in the first few mins. Maintain that level for 45 mins.?  norethindrone, 1 tablet, Oral, Daily?  ondansetron, 4 mg, Oral, Q8H PRN?	?  buPROPion SR, Take 150 mg by mouth. ?	Social History: reports that she has never smoked. She has never used smokeless tobacco. She reports current drug use. Drug: Marijuana. She reports that she does not drink alcohol. ?	Menstrual/Sexual History:as noted; no current partner.?	Concerns for Domestic Violence: None OB History Gravida Para Term Preterm AB Living 0 0 0 0 0 0 SAB TAB Ectopic Molar Multiple Live Births 0 0 0 0 0 0 Review of Systems Pt denies the following NEW symptoms:  Items in BOLD are positiveGeneral: Unexplained wt.gain/lossRespiratory: Cough, SOB, wheezeCardiac:  Chest pain, palpitationsBreast:  Breast mass, nipple dischargeGI:  Abdominal pain, constipation, diarrhea, bleedingGYN:  Change in menses, painful intercourse, vaginal dischargeGU:   Urinary incontinencePsych:  Anxiety/depressionEndocrine:  Intolerance to heat/coldNeuro:  Headache, weakness, memory lossObjective: BP 114/68  - Ht 5' 4 (1.626 m)  - Wt 62.1 kg  - LMP 05/11/2020 (Exact Date)  - BMI 23.52 kg/m?  (137 lbs.)OBGyn Exam?	General:  Appears well, no distress	?	Lungs: Good breath sounds; no rales or rhonchi.?	Thyroid:normal to inspection and palpation?	Heart: Rhythm regular.  Normal S1 and S2.  No murmurs, gallops, or rubs.	?	Breast Exam:Normal appearance, no masses or tenderness?	Abdomen: soft, nontender, no masses palpated, no hepatosplenomegaly	?	External Genitalia: General appearance; normal	?	Uterus:Normal size, contour, non-tender?	Vagina: Normal appearance	?	Cervix::Cervix has normal appearance			  ?	Adnexa: Normal		?	Patient consented for cultures: YesHealth Maintenance: PAP:  N/AChlamydia & GC screening: Ordered todaySTD Blood Work: N/AAssessment / Plan: Leah Lyons is a 19 y.o. G0P0000 female Annual exam:  Screening as noted above.Patient Education:?	Specific topics reviewed: contraception: BTB on micronor - samples of slynd given; this worked well last year but there was an Paediatric nurse.  We can a strong case that it should be authorized if there is no BTB on it.  Will try for next 3 months.  ?	? Menstrual migraines - will d/w neuro.  She is not currently sexually active, so now would be the time to d/c the pill to look for this pattern.  Is harder to establish on continuous ocp's.?	STD prevention: strongly recommended continued condom use, especially at college. She does report a latex allergy, so efficacy of non-latex condoms is somewhat lower.?	Dysmenorrhea - most likely increased b/c  of the BTB.  She has no other sx of endo. Did discuss diagnosis generally made on laparoscopy. Will hold on further evaluation for now as her exam is completely benign.?	Routine labs - referred to pedi.Leah Sciara, MD

## 2020-06-06 ENCOUNTER — Encounter: Admit: 2020-06-06 | Payer: PRIVATE HEALTH INSURANCE | Primary: Adolescent Medicine

## 2020-06-06 ENCOUNTER — Encounter: Admit: 2020-06-06 | Payer: BLUE CROSS/BLUE SHIELD | Attending: Family Medicine | Primary: Adolescent Medicine

## 2020-06-06 ENCOUNTER — Encounter: Admit: 2020-06-06 | Payer: PRIVATE HEALTH INSURANCE | Attending: Family Medicine | Primary: Adolescent Medicine

## 2020-06-06 DIAGNOSIS — G43909 Migraine, unspecified, not intractable, without status migrainosus: Secondary | ICD-10-CM

## 2020-06-06 DIAGNOSIS — J45909 Unspecified asthma, uncomplicated: Secondary | ICD-10-CM

## 2020-06-06 DIAGNOSIS — L709 Acne, unspecified: Secondary | ICD-10-CM

## 2020-06-06 DIAGNOSIS — G43109 Migraine with aura, not intractable, without status migrainosus: Secondary | ICD-10-CM

## 2020-06-06 DIAGNOSIS — R519 Headache: Secondary | ICD-10-CM

## 2020-06-06 MED ORDER — RIMEGEPANT 75 MG DISINTEGRATING TABLET
75 mg | ORAL_TABLET | ORAL | 6 refills | Status: AC | PRN
Start: 2020-06-06 — End: 2021-10-05

## 2020-06-06 NOTE — Progress Notes
VIDEO TELEHEALTH VISIT: This clinician is part of the telehealth program and is conducting this visit in a currently approved location. For this visit the clinician and patient were present via interactive audio & video telecommunications system that permits real-time communications. Consent was signed via the Patient Acknowledgement and Financial Authorization Form and the Ambulatory Telehealth Consent Form. Patient is located in [place] state.The clinician is appropriately licensed in the above state to provide care for this visit. Other individuals present during the telehealth encounter and their role/relation:  noneIf billing based on time, please complete (Not required if billing based on MDM):                           Total time spent in medical video consultation: 16 min Total time spent by the provider on the day of service, which includes time spent on chart review, medical video consultation, education, coordination of care/services and counseling 22 min Because this visit was completed over video, a hands-on physical exam was not performed.  Patient/parent or guardian understands and knows to call back if condition changes. Interval Hx: Leah Lyons is a pleasant 19 year old female who presents today for a follow up of headaches. She was last seen on 04/06/2020. Today she states she is home from college for Christmas break. She states headaches are up in the air it's been everywhere. She estimates she had 1 migraine in November and none so far in December.  She denies breakthrough mild headache days at this time. She primarily uses Excedrin/Advil and drinks a lot of fluids when she gets a headache. She states she had had to go to the ED 4 times over the last year as severe headaches do not respond to OTC analgesics. In general, she states headache activity is decreased at this time.She continues Magnesium/Riboflavin daily as well as [employer] for prevention.She is currently on a progestin only OCP and saw her OBGYN yesterday. She is curious about going onto [facility] and is unsure of making the switch in regards to headache activity.Current Headache Medications:Preventives:LamictalEmgality MagnesiumRiboflavinWellbutrin?Abortives:DHE spray - no longer using Nerivio - has not received yetZofranAllergies Allergen Reactions ? Latex Rash Current Outpatient Medications on File Prior to Visit Medication Sig Dispense Refill ? dihydroergotamine (MIGRANAL) 0.5 mg/pump act. (4 mg/mL) nasal spray Use 1 spray in each nostril as needed for migraine. Use in one nostril as directed.  No more than 4 sprays in one hour (Patient not taking: Reported on 04/06/2020) 8 mL 12 ? divalproex (DEPAKOTE DR) 250 mg delayed release tablet Take 250 mg by mouth 3 (three) times daily. (Patient not taking: Reported on 04/06/2020)   ? divalproex (DEPAKOTE DR) 500 mg delayed release tablet Take 500 mg by mouth 3 (three) times daily. (Patient not taking: Reported on 04/06/2020)   ? galcanezumab-gnlm (EMGALITY) 120 mg/mL pen injector Inject 1 mL (120 mg total) under the skin every 28 days. 1 mL 10 ? lamoTRIgine (LAMICTAL) 100 mg immediate release tablet Take 100 mg by mouth daily.   ? Nerivio device Start within 60 mins of migraine onset. Set a strong, yet comfortable intensity level in the first few mins. Maintain that level for 45 mins. (Patient not taking: Reported on 04/06/2020) 1 each 12 ? norethindrone (MICRONOR) 0.35 mg tablet Take 1 tablet (0.35 mg total) by mouth daily. 84 tablet 2 ? ondansetron (ZOFRAN) 4 mg tablet Take 1 tablet (4 mg total) by mouth every 8 (eight) hours as needed for nausea (associated with  migraine). 20 tablet 5 ? sertraline (ZOLOFT) 100 mg tablet Take 100 mg by mouth daily.   No current facility-administered medications on file prior to visit. PHYSICAL EXAM:No vitals were taken today due to the video nature of this encounter.Wt Readings from Last 3 Encounters: 04/06/20 65.8 kg 01/06/20 65.8 kg 05/25/19 65.8 kg (76 %, Z= 0.70)* * Growth percentiles are based on CDC (Girls, 2-20 Years) data. Temp Readings from Last 3 Encounters: 12/31/18 98.7 ?F (37.1 ?C) (Temporal) 03/11/18 98.6 ?F (37 ?C) (Temporal) 10/27/17 97.5 ?F (36.4 ?C) (Temporal) BP Readings from Last 3 Encounters: 12/31/18 110/70 12/15/18 118/84 12/01/18 104/70 Pulse Readings from Last 3 Encounters: 12/15/18 76 12/01/18 64 03/11/18 72 NEURO EXAM per video observation:Mental Status:Affect is appropriate, Alert, interactive and attentiveOriented to person, place, time and situationMemory recent and remote -intactLanguage fluent with no dysarthria or aphasiaSpeech is clear, not dyarthric.  Attention span  wnl for ageConcentration wnl for ageCranial Nerves: HQI:ONGEXBMWU brow and smile, no droopingVIII: gross hearing intactTremor: absentAssessment & Plan:1. 1. Migraine with aura and without status migrainosus, not intractable   PLAN:1. Rimgepant 75mg  ODT at onset of acute headache2. Submit PA for Nerivio device3. Check TSH today - outstanding lab from 07/[id number]. Regarding a change in OCP I advised that switching from a progestin only to another progestin only OCP would be safe though that anytime there is a change in medication an adjustment period is not unexpected, and it is possible headache activity could temporarily increase.5. RTC 3-6 months or sooner if neededLifestyle modifications:Sleep 8 hours per nightDrink at least 8 glasses of water per dayLimit coffee/caffeine to 1-2 cups per dayEat regular mealsExercise daily -Patient has been educated to maintain their headache diary.  (I advised Curelator for Headache, an app designed to identify triggers and premonitory signs of migraine). There is also iHeadache, an iPhone app, available as an online service <www.iheadache.com>; for the Android system, there is Headache tracker Pro and Migraine Buddy). -Patient has been counseled to call with any questions, concerns, or new symptoms that arise before the next scheduled appointment and has verbalized and understanding.-I have reviewed the most common and severe side effects of the above medications and the patient understands and agrees to take them.-Patient has been advised to follow up in 4-6 months.-Patient has been educated on the availability of services provided in the infusion clinic and encouraged to call when in need.-Please keep in mind that preventive medications may take 4-12 weeks to be effective in reducing the frequency of your headaches. -If your medication does not seem to be working or if you are experiencing side effects please call the office Monday through Friday 8 AM to 5PM (calling early in the day is best) and we can discuss.I spent 22 minutes in this visit with 16 minutes dedicated to virtual patient counseling. Sharen Counter, APRN

## 2020-06-08 ENCOUNTER — Ambulatory Visit: Admit: 2020-06-08 | Payer: PRIVATE HEALTH INSURANCE | Attending: Urology | Primary: Adolescent Medicine

## 2020-06-21 ENCOUNTER — Encounter: Admit: 2020-06-21 | Payer: BLUE CROSS/BLUE SHIELD | Attending: Family Medicine | Primary: Adolescent Medicine

## 2020-07-10 ENCOUNTER — Encounter: Admit: 2020-07-10 | Payer: PRIVATE HEALTH INSURANCE | Primary: Adolescent Medicine

## 2020-07-24 ENCOUNTER — Encounter: Admit: 2020-07-24 | Payer: PRIVATE HEALTH INSURANCE | Attending: Urology | Primary: Adolescent Medicine

## 2020-07-24 ENCOUNTER — Ambulatory Visit: Admit: 2020-07-24 | Payer: BLUE CROSS/BLUE SHIELD | Attending: Urology | Primary: Adolescent Medicine

## 2020-07-24 DIAGNOSIS — G43909 Migraine, unspecified, not intractable, without status migrainosus: Secondary | ICD-10-CM

## 2020-07-24 DIAGNOSIS — R519 Headache: Secondary | ICD-10-CM

## 2020-07-24 DIAGNOSIS — N3281 Overactive bladder: Secondary | ICD-10-CM

## 2020-07-24 DIAGNOSIS — L709 Acne, unspecified: Secondary | ICD-10-CM

## 2020-07-24 DIAGNOSIS — J45909 Unspecified asthma, uncomplicated: Secondary | ICD-10-CM

## 2020-07-24 MED ORDER — TERAZOSIN 2 MG CAPSULE
2 mg | ORAL_CAPSULE | Freq: Every evening | ORAL | 4 refills | Status: AC
Start: 2020-07-24 — End: 2020-10-02

## 2020-07-24 NOTE — Progress Notes
[facility] Bladder and Continence ProgramReturn Patient NoteVIDEO TELEHEALTH VISIT: This clinician is part of the telehealth program and is conducting this visit in a currently approved location. For this visit the clinician and patient were present via interactive audio & video telecommunications system that permits real-time communications, via the [employer].Patient's use of the telehealth platform followed consent and acknowledges agreement to permit telehealth for this visit. State patient is located in: [place] clinician is appropriately licensed in the above state to provide care for this visit. Other individuals present during the telehealth encounter and their role/relation: noneIf billing based on time, please complete (Not required if billing based on MDM):                           Total time spent in medical video consultation: 20; Total time spent by the provider on the day of service, which includes time spent on chart review, medical video consultation, education, coordination of care/services and counseling Because this visit was completed over video, a hands-on physical exam was not performed.  Patient/parent or guardian understands and knows to call back if condition changes.Leah Lyons is a 20 y.o. female here for a Return Patient visit in our Bladder and Continence clinic. Leah Lyons reports that she has had worsening urgency and frequency.  She is voiding 10+ times per day.  There are times where she has to rush to the bathroom.  She is also waking overnight to void.  She is dry overnight, but will have some leaking a few times a week associated with coughing.  She denies dysuria or gross hematuria.  She has not had recent UTI. She continues to get dizzy when standing up. Leah Lyons is having a BM daily, sometimes twice a day. Leah Lyons recently had COVID.  She reports improved migraine headaches after changing her OCP 2 months ago.  She also recently had shoulder surgery.  Changed OCP 2 months ago and has not had a migraine The interim voiding and continence history is listed below:     Initial Vancouver Scores: VANCOUVER SCORES 06/30/2017 [place] Daytime Score 10 Vancouver Nighttime Score 0 [place] BM Score 2 [place] Total Score 12  AllergiesAllergies Allergen Reactions ? Latex Wheezing Medication ListCurrent Outpatient Medications on File Prior to Visit Medication Sig Dispense Refill ? buPROPion SR (WELLBUTRIN SR) 150 mg 12 hr tablet Take 150 mg by mouth.   ? lamoTRIgine (LAMICTAL) 100 mg immediate release tablet Take 100 mg by mouth daily.   ? galcanezumab-gnlm (EMGALITY) 120 mg/mL pen injector Inject 1 mL (120 mg total) under the skin every 28 days. (Patient not taking: Reported on 07/24/2020) 1 mL 10 ? Nerivio device Start within 60 mins of migraine onset. Set a strong, yet comfortable intensity level in the first few mins. Maintain that level for 45 mins. (Patient not taking: Reported on 07/24/2020) 1 each 12 ? norethindrone (MICRONOR) 0.35 mg tablet Take 1 tablet (0.35 mg total) by mouth daily. (Patient not taking: Reported on 07/24/2020) 84 tablet 2 ? ondansetron (ZOFRAN) 4 mg tablet Take 1 tablet (4 mg total) by mouth every 8 (eight) hours as needed for nausea (associated with migraine). (Patient not taking: Reported on 07/24/2020) 20 tablet 5 ? rimegepant 75 mg TbDL Take 1 tablet (75 mg total) by mouth as needed (maximum dose 75mg  /24hours). (Patient not taking: Reported on 07/24/2020) 8 tablet 5 No current facility-administered medications on file prior to visit. Review of Systems:  A review  of systems was included on the intake form, which I reviewed.  There is no additional contributory information noted.  Physical ExaminationHt 5' 4 (1.626 m)  - Wt 61.2 kg  - BMI 23.17 kg/m? Appearance: WD/WNHEENT:  No periorbital edema Pulmonary:  Easy WOB Remainder of exam limited due to nature of video visit No results found.Diagnosis:  ICD-10-CM  1. 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  She had been off terazosin and oxybutynin for quite some time, but recently has had regression in LUTS.  1. Urgency/frequency ?	Restart terazosin, 2 mg x 1 week, increasing to 4 mg nightly.  Reviewed side effects and recommendation to increase fluids and salt intake. ?	Collect 1st morning void at [facility]. ?	Follow up in 1 month. Orders Placed This Encounter Procedures ? Urine culture ? Urinalysis with microscopic     (GH L Q) Other orders placed this visit: Medications ? terazosin (HYTRIN) 2 mg capsule Return in about 4 weeks (around 08/21/2020) for Telemed visit.Vick Frees, MHS, PA-CYale [facility] UrologyMain Telephone: [phone] Heartbeat: [phone]

## 2020-07-24 NOTE — Patient Instructions
Go to [facility] to get a urine specimen cup and collect a 1st morning urine sample. Send [place] a message once you collect the sample and at which lab you went to. Restart terazosin 2 mg nightly for 1 week then increase to 4 mg nightly if you tolerate it well - increase hydration and salt intake Follow up video visit in 1 month

## 2020-08-21 ENCOUNTER — Encounter
Admit: 2020-08-21 | Payer: PRIVATE HEALTH INSURANCE | Attending: Obstetrics and Gynecology | Primary: Adolescent Medicine

## 2020-08-21 DIAGNOSIS — N912 Amenorrhea, unspecified: Secondary | ICD-10-CM

## 2020-08-21 MED ORDER — SLYND 4 MG (28) TABLET
428 mg (28) | ORAL_TABLET | Freq: Every day | ORAL | 1 refills | Status: AC
Start: 2020-08-21 — End: 2020-11-06

## 2020-08-21 NOTE — Telephone Encounter
Patient requesting refill on Slynd. She was given samples at last visit. She states she has not had a headache or migraine since starting it. Rx set up in chart, [facility] [place]. Pt advised you are in the hospital today. Marcelino Duster

## 2020-08-22 ENCOUNTER — Encounter: Admit: 2020-08-22 | Payer: PRIVATE HEALTH INSURANCE | Primary: Adolescent Medicine

## 2020-09-28 ENCOUNTER — Encounter: Admit: 2020-09-28 | Payer: BLUE CROSS/BLUE SHIELD | Attending: Family Medicine | Primary: Adolescent Medicine

## 2020-09-28 ENCOUNTER — Encounter: Admit: 2020-09-28 | Payer: PRIVATE HEALTH INSURANCE | Attending: Family Medicine | Primary: Adolescent Medicine

## 2020-09-28 DIAGNOSIS — J45909 Unspecified asthma, uncomplicated: Secondary | ICD-10-CM

## 2020-09-28 DIAGNOSIS — G43909 Migraine, unspecified, not intractable, without status migrainosus: Secondary | ICD-10-CM

## 2020-09-28 DIAGNOSIS — G43109 Migraine with aura, not intractable, without status migrainosus: Secondary | ICD-10-CM

## 2020-09-28 DIAGNOSIS — L709 Acne, unspecified: Secondary | ICD-10-CM

## 2020-09-28 DIAGNOSIS — R519 Headache: Secondary | ICD-10-CM

## 2020-09-28 MED ORDER — RIZATRIPTAN 10 MG TABLET
10 mg | ORAL | Status: AC | PRN
Start: 2020-09-28 — End: ?

## 2020-09-28 NOTE — Progress Notes
VIDEO TELEHEALTH VISIT: This clinician is part of the telehealth program and is conducting this visit in a currently approved location. For this visit the clinician and patient were present via interactive audio & video telecommunications system that permits real-time communications. Consent was signed via the Patient Acknowledgement and Financial Authorization Form and the Ambulatory Telehealth Consent Form. Patient is located in [place] state today, an approved state for telehealth under current state guidelines at the time of this visit. Other individuals present during the telehealth encounter and their role/relation:  noneIf billing based on time, please complete (Not required if billing based on MDM):                           Total time spent in medical video consultation: Total time spent by the provider on the day of service, which includes time spent on chart review, medical video consultation, education, coordination of care/services and counseling Because this visit was completed over video, a hands-on physical exam was not performed.  Patient/parent or guardian understands and knows to call back if condition changes. Interval Hx: Leah Lyons is a pleasant 20 year old woman who presents today for a follow up of headaches. She was last seen on 06/06/2020. She was recently seen by neurologist Dr. Stefanie Libel 09/13/2020 at [facility] in [place] where she primarily lives at this time.  She states she is doing well and has not had a migraine since November 2021.  She changed her OCP to [facility]  4-5 months ago and feels this may have been helping. She has needed to use Rizatriptan one time in the last few months. This provided relief within an hour and she did not need to repeat a dose.She has also been able to pick up and use Nerivio device which she states works well for her.  She will use this at onset of mild aura and wears this to class with no headache progression. She states this has helped with the functionality of her day and feels in general she is doing very well at this time.Allergies Allergen Reactions ? Latex Wheezing Current Outpatient Medications on File Prior to Visit Medication Sig Dispense Refill ? buPROPion SR (WELLBUTRIN SR) 150 mg 12 hr tablet Take 150 mg by mouth.   ? drospirenone, contraceptive, (SLYND) 4 mg (28) Tab Take 4 mg by mouth daily. 84 tablet 0 ? lamoTRIgine (LAMICTAL) 100 mg immediate release tablet Take 100 mg by mouth daily.   ? rizatriptan (MAXALT) 10 mg tablet Take 10 mg by mouth as needed for migraine. May repeat in 2 hours if needed   ? terazosin (HYTRIN) 2 mg capsule Take 2 capsules (4 mg total) by mouth nightly. 60 capsule 3 ? galcanezumab-gnlm (EMGALITY) 120 mg/mL pen injector Inject 1 mL (120 mg total) under the skin every 28 days. (Patient not taking: Reported on 07/24/2020) 1 mL 10 ? Nerivio device Start within 60 mins of migraine onset. Set a strong, yet comfortable intensity level in the first few mins. Maintain that level for 45 mins. (Patient not taking: Reported on 07/24/2020) 1 each 12 ? ondansetron (ZOFRAN) 4 mg tablet Take 1 tablet (4 mg total) by mouth every 8 (eight) hours as needed for nausea (associated with migraine). (Patient not taking: Reported on 07/24/2020) 20 tablet 5 ? rimegepant 75 mg TbDL Take 1 tablet (75 mg total) by mouth as needed (maximum dose 75mg  /24hours). (Patient not taking: Reported on 09/28/2020) 8 tablet 5 No  current facility-administered medications on file prior to visit. PHYSICAL EXAM:No vitals were taken today due to the video nature of this encounter.Wt Readings from Last 3 Encounters: 07/24/20 61.2 kg 06/06/20 63.5 kg 06/05/20 62.1 kg Temp Readings from Last 3 Encounters: 12/31/18 98.7 ?F (37.1 ?C) (Temporal) 03/11/18 98.6 ?F (37 ?C) (Temporal) 10/27/17 97.5 ?F (36.4 ?C) (Temporal) BP Readings from Last 3 Encounters: 06/05/20 114/68 12/31/18 110/70 12/15/18 118/84 Pulse Readings from Last 3 Encounters: 12/15/18 76 12/01/18 64 03/11/18 72 NEURO EXAM per video observation:Mental Status:Affect is appropriate, Alert, interactive and attentiveOriented to person, place, time and situationMemory recent and remote -intactLanguage fluent with no dysarthria or aphasiaSpeech is clear, not dyarthric.  Attention span  wnl for ageConcentration wnl for ageCranial Nerves: ZOX:[id number] brow and smile, no droopingVIII: gross hearing intactTremor: absentAssessment & Plan:1. 1. Migraine with aura and without status migrainosus, not intractable   PLAN:[name] is doing well at this time and no changes to her treatment plan are recommended.She plans to continue following with her neurologist in [place] from this point forward and will contact our office in future if in need.-Patient has been educated to maintain their headache diary.  (I advised Curelator for Headache, an app designed to identify triggers and premonitory signs of migraine). There is also iHeadache, an iPhone app, available as an online service <www.iheadache.com>; for the Android system, there is Headache tracker Pro and Migraine Buddy). -Patient has been counseled to call with any questions, concerns, or new symptoms that arise before the next scheduled appointment and has verbalized and understanding.-I have reviewed the most common and severe side effects of the above medications and the patient understands and agrees to take them.-Patient has been advised to follow up if needed-Patient has been educated on the availability of services provided in the infusion clinic and encouraged to call when in need.-Please keep in mind that preventive medications may take 4-12 weeks to be effective in reducing the frequency of your headaches. -If your medication does not seem to be working or if you are experiencing side effects please call the office Monday through Friday 8 AM to 5PM (calling early in the day is best) and we can discuss.I spent 20 minutes in this visit with 12 minutes dedicated to virtual patient counseling. Sharen Counter, APRN

## 2020-09-28 NOTE — Progress Notes
The following is the transcribed Review of Systems, done by the patient at Intake, and attached to this is the attending physician's documentation.Review of Systems Constitutional: Negative.  HENT: Negative.  Eyes: Negative.  Respiratory: Negative.  Cardiovascular: Negative.  Gastrointestinal: Negative.  Genitourinary: Negative.  Musculoskeletal: Negative.  Skin: Negative.  Neurological: Positive for headaches.      Better for about 4 mo no h/a All other systems reviewed and are negative.

## 2020-10-02 ENCOUNTER — Ambulatory Visit: Admit: 2020-10-02 | Payer: PRIVATE HEALTH INSURANCE | Attending: Urology | Primary: Adolescent Medicine

## 2020-10-02 DIAGNOSIS — N3281 Overactive bladder: Secondary | ICD-10-CM

## 2020-10-02 MED ORDER — TERAZOSIN 5 MG CAPSULE
5 mg | ORAL_CAPSULE | Freq: Every evening | ORAL | 4 refills | Status: AC
Start: 2020-10-02 — End: 2021-10-05

## 2020-10-02 NOTE — Progress Notes
[facility] Bladder and Continence ProgramReturn Patient NoteVIDEO TELEHEALTH VISIT: This clinician is part of the telehealth program and is conducting this visit in a currently approved location. For this visit the clinician and patient were present via interactive audio & video telecommunications system that permits real-time communications, via the [employer].Patient's use of the telehealth platform followed consent and acknowledges agreement to permit telehealth for this visit. State patient is located in: [place] clinician is appropriately licensed in the above state to provide care for this visit. Other individuals present during the telehealth encounter and their role/relation: noneIf billing based on time, please complete (Not required if billing based on MDM):                           Total time spent in medical video consultation: 20; Total time spent by the provider on the day of service, which includes time spent on chart review, medical video consultation, education, coordination of care/services and counseling Because this visit was completed over video, a hands-on physical exam was not performed.  Patient/parent or guardian understands and knows to call back if condition changes.Leah Lyons is a 20 y.o. female here for a Return Patient visit in our Bladder and Continence clinic. Better than before but still waking up overnight infrequently. Has bad days where she is voiding frequently - 10 days a month . Estimates 7-8 times. Not as much rushing, half the bad days will rush No dysuriaNo UTIs No gross hematuria Using metamucil which is helping. BM daily. The interim voiding and continence history is listed below:     Initial [place] Scores: VANCOUVER SCORES 06/30/2017 [place] Daytime Score 10 [place] Nighttime Score 0 Vancouver BM Score 2 Vancouver Total Score 12 AllergiesAllergies Allergen Reactions ? Latex Wheezing Medication ListCurrent Outpatient Medications on File Prior to Visit Medication Sig Dispense Refill ? buPROPion SR (WELLBUTRIN SR) 150 mg 12 hr tablet Take 150 mg by mouth.   ? drospirenone, contraceptive, (SLYND) 4 mg (28) Tab Take 4 mg by mouth daily. 84 tablet 0 ? lamoTRIgine (LAMICTAL) 100 mg immediate release tablet Take 100 mg by mouth daily.   ? rizatriptan (MAXALT) 10 mg tablet Take 10 mg by mouth as needed for migraine. May repeat in 2 hours if needed   ? [DISCONTINUED] terazosin (HYTRIN) 2 mg capsule Take 2 capsules (4 mg total) by mouth nightly. 60 capsule 3 ? galcanezumab-gnlm (EMGALITY) 120 mg/mL pen injector Inject 1 mL (120 mg total) under the skin every 28 days. (Patient not taking: Reported on 07/24/2020) 1 mL 10 ? Nerivio device Start within 60 mins of migraine onset. Set a strong, yet comfortable intensity level in the first few mins. Maintain that level for 45 mins. (Patient not taking: Reported on 07/24/2020) 1 each 12 ? ondansetron (ZOFRAN) 4 mg tablet Take 1 tablet (4 mg total) by mouth every 8 (eight) hours as needed for nausea (associated with migraine). (Patient not taking: Reported on 07/24/2020) 20 tablet 5 ? rimegepant 75 mg TbDL Take 1 tablet (75 mg total) by mouth as needed (maximum dose 75mg  /24hours). (Patient not taking: Reported on 09/28/2020) 8 tablet 5 No current facility-administered medications on file prior to visit. Review of Systems:  A review of systems was included on the intake form, which I reviewed.  There is no additional contributory information noted.  Physical ExaminationHt 5' 4 (1.626 m)  - Wt 61.2 kg  - BMI 23.17 kg/m? 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  Her symptoms have improved although not resolved on terazosin 4 mg nightly ?1. Urgency/frequency ?	Increase terazosin to to 5 mg nightly.  Reviewed side effects and recommendation to increase fluids and salt intake. ?	Follow up in 2 months. No orders of the defined types were placed in this encounter.Other orders placed this visit: Medications ? terazosin (HYTRIN) 5 mg capsule Return in about 2 months (around 12/02/2020) for Telemed visit.Vick Frees, MHS, PA-[facility]ale [facility] UrologyMain Telephone: [phone] Heartbeat: [phone]

## 2020-10-04 ENCOUNTER — Telehealth: Admit: 2020-10-04 | Payer: PRIVATE HEALTH INSURANCE | Attending: Emergency Medicine | Primary: Adolescent Medicine

## 2020-10-04 NOTE — Telephone Encounter
Called [facility] to have patients results from [facility] faxed to [phone] today for Leah Lyons per request

## 2020-10-04 NOTE — Telephone Encounter
Reviewed. Spec grav 1.029. Mod calcium ox crystals.

## 2020-10-10 ENCOUNTER — Encounter: Admit: 2020-10-10 | Payer: BLUE CROSS/BLUE SHIELD | Attending: Family Medicine | Primary: Adolescent Medicine

## 2020-11-06 ENCOUNTER — Encounter
Admit: 2020-11-06 | Payer: PRIVATE HEALTH INSURANCE | Attending: Obstetrics and Gynecology | Primary: Adolescent Medicine

## 2020-11-06 DIAGNOSIS — N912 Amenorrhea, unspecified: Secondary | ICD-10-CM

## 2020-11-06 MED ORDER — SLYND 4 MG (28) TABLET
4 mg (28) | ORAL_TABLET | Freq: Every day | ORAL | 2 refills | Status: AC
Start: 2020-11-06 — End: 2021-04-06

## 2020-11-06 NOTE — Telephone Encounter
BLM,Pt had annual 06/05/20 and is requesting a refill on her sylnd.Script set up for review and pharmacy confirmedLC

## 2020-12-11 ENCOUNTER — Telehealth
Admit: 2020-12-11 | Payer: PRIVATE HEALTH INSURANCE | Attending: Obstetrics and Gynecology | Primary: Adolescent Medicine

## 2020-12-11 NOTE — Telephone Encounter
Lm on vm - please try to set up a telehealth visit for her for sometime later in the week.  She is in [facility] - make this is ok if she is out of state.

## 2020-12-11 NOTE — Telephone Encounter
Pt last saw you 12/21 for her annual. She is in [place] and would like to speak with you regarding her irregular cycles. She reports she is bleeding in the middle of her pack and it is effecting her migraines. Pt can be reached at [phone]. Please advise. Thank You.

## 2020-12-12 NOTE — Telephone Encounter
Pt is scheduled for telehealth tomorrow. Pt will call if unable to keep appt to reschedule.

## 2020-12-13 ENCOUNTER — Encounter: Admit: 2020-12-13 | Payer: BLUE CROSS/BLUE SHIELD | Attending: Obstetrics and Gynecology | Primary: Adolescent Medicine

## 2021-03-19 ENCOUNTER — Ambulatory Visit
Admission: RE | Admit: 2021-03-19 | Discharge: 2021-03-19 | Disposition: A | Discharge status: Home / Self Care | Payer: BC Managed Care – PPO | Source: Ambulatory Visit | Attending: Sports Medicine | Admitting: Sports Medicine

## 2021-03-19 ENCOUNTER — Other Ambulatory Visit: Payer: Self-pay | Admitting: Sports Medicine

## 2021-03-19 DIAGNOSIS — M25311 Other instability, right shoulder: Secondary | ICD-10-CM

## 2021-04-06 ENCOUNTER — Encounter
Admit: 2021-04-06 | Payer: PRIVATE HEALTH INSURANCE | Attending: Obstetrics and Gynecology | Primary: Adolescent Medicine

## 2021-04-06 DIAGNOSIS — N912 Amenorrhea, unspecified: Secondary | ICD-10-CM

## 2021-04-06 MED ORDER — SLYND 4 MG (28) TABLET
4 mg (28) | ORAL_TABLET | Freq: Every day | ORAL | 1 refills | Status: AC
Start: 2021-04-06 — End: 2021-06-22

## 2021-04-06 NOTE — Telephone Encounter
Pt called requesting med refill on Drospirenone, contraceptive, (SLYND) 4 mg (28) tablet, confirmed pharmacy of choice. Annual exam scheduled with [facility] on 06/14/21. Rx created, pls sign off. KG.

## 2021-04-08 ENCOUNTER — Encounter
Admit: 2021-04-08 | Payer: PRIVATE HEALTH INSURANCE | Attending: Obstetrics and Gynecology | Primary: Adolescent Medicine

## 2021-04-08 DIAGNOSIS — N912 Amenorrhea, unspecified: Secondary | ICD-10-CM

## 2021-06-14 ENCOUNTER — Encounter: Admit: 2021-06-14 | Payer: BLUE CROSS/BLUE SHIELD | Attending: Obstetrics and Gynecology | Primary: Adolescent Medicine

## 2021-06-22 ENCOUNTER — Encounter
Admit: 2021-06-22 | Payer: PRIVATE HEALTH INSURANCE | Attending: Obstetrics and Gynecology | Primary: Adolescent Medicine

## 2021-06-22 DIAGNOSIS — N912 Amenorrhea, unspecified: Secondary | ICD-10-CM

## 2021-06-22 MED ORDER — SLYND 4 MG (28) TABLET
4 mg (28) | ORAL_TABLET | Freq: Every day | ORAL | 2 refills | Status: AC
Start: 2021-06-22 — End: 2021-10-05

## 2021-06-22 NOTE — Telephone Encounter
Pt called requesting med refill, 5 mos supply of drospirenone, contraceptive, (SLYND) 4 mg (28) Tab. Pt is scheduled for annual with [facility] on 10/09/21 once she returns from [place]. Pt is requesting 5 mos supply as she will be away, leaving 06/25/21, returning 10/08/21. Rx created. If applicable, pls sign off. KG.

## 2021-10-05 ENCOUNTER — Encounter
Admit: 2021-10-05 | Payer: PRIVATE HEALTH INSURANCE | Attending: Obstetrics and Gynecology | Primary: Adolescent Medicine

## 2021-10-05 ENCOUNTER — Ambulatory Visit: Admit: 2021-10-05 | Payer: BLUE CROSS/BLUE SHIELD | Attending: Obstetrics and Gynecology | Primary: Adolescent Medicine

## 2021-10-05 DIAGNOSIS — R519 Headache: Secondary | ICD-10-CM

## 2021-10-05 DIAGNOSIS — N912 Amenorrhea, unspecified: Secondary | ICD-10-CM

## 2021-10-05 DIAGNOSIS — J45909 Unspecified asthma, uncomplicated: Secondary | ICD-10-CM

## 2021-10-05 DIAGNOSIS — G43909 Migraine, unspecified, not intractable, without status migrainosus: Secondary | ICD-10-CM

## 2021-10-05 DIAGNOSIS — Z01419 Encounter for gynecological examination (general) (routine) without abnormal findings: Secondary | ICD-10-CM

## 2021-10-05 DIAGNOSIS — L709 Acne, unspecified: Secondary | ICD-10-CM

## 2021-10-05 MED ORDER — SLYND 4 MG (28) TABLET
428 mg (28) | ORAL_TABLET | Freq: Every day | ORAL | 4 refills | Status: AC
Start: 2021-10-05 — End: 2022-03-18

## 2021-10-05 NOTE — Progress Notes
ZOX:[id number] Leah Lyons is a 21 y.o. G0P0000 female who presents to this practice for an annual exam.No LMP recorded (exact date). (Menstrual status: None to light menses on OCP). Menses were regular on slynd.  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  Last semester in [place] - loved it!Patient History?	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 ?	Past Surgical History: has a past surgical history that includes shoulder (Right).?	Past Medical History: has a past medical history of Acne, Asthma, Headache, and Migraines.?	Family History: family history includes Breast cancer in her mother.?	Allergies:is allergic to latex. Medications: Current Outpatient Medications: ?  Slynd, 4 mg, Oral, Daily?  Nerivio, Start within 60 mins of migraine onset. Set a strong, yet comfortable intensity level in the first few mins. Maintain that level for 45 mins.?  rizatriptan, 10 mg, Oral, PRN?	?  buPROPion SR, Take 150 mg by mouth. ?	Social History: reports that she has been smoking. She uses smokeless tobacco. She reports current drug use. Drug: Marijuana. She reports that she does not drink alcohol. ?	Menstrual/Sexual History:as noted; 1 partner?	Concerns for Domestic Violence: None OB History Gravida Para Term Preterm AB Living 0 0 0 0 0 0 SAB IAB Ectopic Molar Multiple Live Births 0 0 0 0 0 0 Review of Systems Pt denies the following NEW symptoms:  Items in BOLD are positiveGeneral: Unexplained wt.gain/lossRespiratory: Cough, SOB, wheezeCardiac:  Chest pain, palpitationsBreast:  Breast mass, nipple dischargeGI:  Abdominal pain, constipation, diarrhea, bleedingGYN:  Change in menses, painful intercourse, vaginal dischargeGU:   Urinary incontinencePsych:  Anxiety/depressionEndocrine: Intolerance to heat/coldNeuro:  Headache, weakness, memory lossChaperone Flowsheet Documentation:Chaperone Present: Yes, sensitive parts of the examination were performed with chaperone presentChaperone Full Name and Role: Leah Lyons MAObjective: BP 118/80  - Ht 5' 4 (1.626 m)  - Wt 65.8 kg  - LMP  (Exact Date)  - BMI 24.89 kg/m?  (145 lbs.)OBGyn Exam?	General:  Appears well, no distress	?	Lungs: Good breath sounds; no rales or rhonchi.?	Thyroid:normal to inspection and palpation?	Heart: Rhythm regular.  Normal S1 and S2.  No murmurs, gallops, or rubs.	?	Breast Exam:Normal appearance, no masses or tenderness?	Abdomen: soft, nontender, no masses palpated, no hepatosplenomegaly	?	External Genitalia: General appearance; normal	?	Uterus:Normal size, contour, non-tender?	Vagina: Normal appearance	?	Cervix::Cervix has normal appearance			  ?	Adnexa: Normal		?	Patient consented for cultures: YesHealth Maintenance: PAP:  Completed todayChlamydia & GC screening: Ordered todaySTD Blood Work: N/AAssessment / Plan: Leah Lyons is a 21 y.o. G0P0000 female Annual exam:  Screening as noted above.Patient Education:?	Specific topics reviewed: contraception: refill slynd.[name], MD

## 2021-10-09 ENCOUNTER — Encounter: Admit: 2021-10-09 | Payer: BLUE CROSS/BLUE SHIELD | Attending: Obstetrics and Gynecology | Primary: Adolescent Medicine

## 2022-03-06 ENCOUNTER — Emergency Department (HOSPITAL_BASED_OUTPATIENT_CLINIC_OR_DEPARTMENT_OTHER)
Admission: EM | Admit: 2022-03-06 | Discharge: 2022-03-06 | Disposition: A | Discharge status: Home / Self Care | Payer: BC Managed Care – PPO | Attending: Emergency Medicine | Admitting: Emergency Medicine

## 2022-03-06 ENCOUNTER — Encounter (HOSPITAL_BASED_OUTPATIENT_CLINIC_OR_DEPARTMENT_OTHER): Payer: Self-pay | Admitting: Pediatrics

## 2022-03-06 ENCOUNTER — Other Ambulatory Visit: Payer: Self-pay

## 2022-03-06 DIAGNOSIS — G43909 Migraine, unspecified, not intractable, without status migrainosus: Secondary | ICD-10-CM | POA: Diagnosis present

## 2022-03-06 DIAGNOSIS — G43119 Migraine with aura, intractable, without status migrainosus: Secondary | ICD-10-CM | POA: Diagnosis not present

## 2022-03-06 DIAGNOSIS — Z91040 Latex allergy status: Secondary | ICD-10-CM | POA: Diagnosis not present

## 2022-03-06 DIAGNOSIS — J45909 Unspecified asthma, uncomplicated: Secondary | ICD-10-CM | POA: Insufficient documentation

## 2022-03-06 MED ORDER — METOCLOPRAMIDE HCL 5 MG/ML IJ SOLN
10.0000 mg | Freq: Once | INTRAMUSCULAR | Status: AC
  Administered 2022-03-06: 10 mg via INTRAVENOUS
  Filled 2022-03-06: qty 2

## 2022-03-06 MED ORDER — DEXAMETHASONE SODIUM PHOSPHATE 10 MG/ML IJ SOLN
10.0000 mg | Freq: Once | INTRAMUSCULAR | Status: AC
  Administered 2022-03-06: 10 mg via INTRAVENOUS
  Filled 2022-03-06: qty 1

## 2022-03-06 MED ORDER — DIPHENHYDRAMINE HCL 50 MG/ML IJ SOLN
12.5000 mg | Freq: Once | INTRAMUSCULAR | Status: AC
  Administered 2022-03-06: 12.5 mg via INTRAVENOUS
  Filled 2022-03-06: qty 1

## 2022-03-06 MED ORDER — SODIUM CHLORIDE 0.9 % IV BOLUS
1000.0000 mL | Freq: Once | INTRAVENOUS | Status: AC
  Administered 2022-03-06: 1000 mL via INTRAVENOUS

## 2022-03-06 MED ORDER — MAGNESIUM SULFATE IN D5W 1-5 GM/100ML-% IV SOLN
1.0000 g | Freq: Once | INTRAVENOUS | Status: AC
  Administered 2022-03-06: 1 g via INTRAVENOUS
  Filled 2022-03-06: qty 100

## 2022-03-06 MED ORDER — KETOROLAC TROMETHAMINE 30 MG/ML IJ SOLN
30.0000 mg | Freq: Once | INTRAMUSCULAR | Status: AC
  Administered 2022-03-06: 30 mg via INTRAVENOUS
  Filled 2022-03-06: qty 1

## 2022-03-06 NOTE — Discharge Instructions (Signed)
You were seen in the emergency department for a migraine.  We gave you a migraine cocktail including toradol, reglan, benadryl, magnesium, and decadron (steroid). I'm glad your symptoms are improving!  I recommend letting your neurologist know about your symptoms in case there are changes they want to make with your medication regimen.  Continue to monitor how you're doing and return to the ER for new or worsening symptoms.

## 2022-03-06 NOTE — ED Triage Notes (Signed)
C/O migraine unable to get relief from prescription meds x 4 days;

## 2022-03-06 NOTE — ED Provider Notes (Signed)
[facility] EMERGENCY DEPARTMENT Provider Note   CSN: [id number] Arrival date & time: 03/06/22  1124     History  Chief Complaint  Patient presents with   Migraine    Brettney Ficken is a 21 y.o. female with history of migraines and asthma who presents the emergency department complaining of a migraine headache.  Patient states that she been on several prescription migraine medications, and follows up with a neurologist.  Most recently they have had her alter how she was taking her birth control because they thought they were related to hormonal changes.  She states that for the past 4 days she has been having her typical migraine, with frontal and ocular pain, beginning to cause pain on the left side of her head.  She states that with previous migraines she has had left sided head and body numbness, but has not had that this time.  Has another roommate who also has migraines and has been having a similar headache recently, they are unsure if there could be mold or something in the house.  She has not been feeling sick.  Nothing else about this migraine is different than her usual.  Has tried her prescribed medicines at home without any relief.  Associated photophobia and discoordination.   Migraine Associated symptoms include headaches.       Home Medications Prior to Admission medications   Medication Sig Start Date End Date Taking? Authorizing Provider  albuterol (VENTOLIN HFA) 108 (90 Base) MCG/ACT inhaler Inhale 2 puffs into the lungs every 4 (four) hours as needed. 01/18/20   [provider]  buPROPion (WELLBUTRIN XL) 150 MG 24 hr tablet Take 150 mg by mouth at bedtime. 03/22/20   [provider]  Galcanezumab-gnlm (EMGALITY) 120 MG/ML SOAJ INJECT 1 ML (120 MG TOTAL) UNDER THE SKIN EVERY 28 DAYS. 01/06/20   [provider]  lamoTRIgine (LAMICTAL) 200 MG tablet Take 200 mg by mouth daily. 04/08/20   [provider]  norethindrone  (MICRONOR) 0.35 MG tablet Take 1 tablet by mouth daily. 04/17/20   [provider]      Allergies    Latex    Review of Systems   Review of Systems  Constitutional:  Negative for chills and fever.  Eyes:  Positive for photophobia.  Gastrointestinal:  Positive for nausea. Negative for vomiting.  Neurological:  Positive for light-headedness and headaches.  All other systems reviewed and are negative.   Physical Exam Updated Vital Signs BP 96/74   Pulse 66   Temp 98.1 F (36.7 C) (Oral)   Resp 18   Ht 5\' 4"  (1.626 m)   Wt 63.5 kg   LMP 03/02/2022   SpO2 96%   BMI 24.03 kg/m  Physical Exam Vitals and nursing note reviewed.  Constitutional:      Appearance: Normal appearance.  HENT:     Head: Normocephalic and atraumatic.  Eyes:     Conjunctiva/sclera: Conjunctivae normal.  Pulmonary:     Effort: Pulmonary effort is normal. No respiratory distress.  Skin:    General: Skin is warm and dry.  Neurological:     Mental Status: She is alert.     Comments: Neuro: Speech is clear, able to follow commands. CN III-XII intact grossly intact. PERRLA. EOMI. Sensation intact throughout. No facial asymmetry.   Psychiatric:        Mood and Affect: Mood normal.        Behavior: Behavior normal.     ED Results / Procedures /  Treatments   Labs (all labs ordered are listed, but only abnormal results are displayed) Labs Reviewed - No data to display  EKG None  Radiology No results found.  Procedures Procedures    Medications Ordered in ED Medications  dexamethasone (DECADRON) injection 10 mg (has no administration in time range)  sodium chloride 0.9 % bolus 1,000 mL (0 mLs Intravenous Stopped 03/06/22 1313)  ketorolac (TORADOL) 30 MG/ML injection 30 mg (30 mg Intravenous Given 03/06/22 1203)  metoCLOPramide (REGLAN) injection 10 mg (10 mg Intravenous Given 03/06/22 1204)  diphenhydrAMINE (BENADRYL) injection 12.5 mg (12.5 mg Intravenous Given 03/06/22 1201)  magnesium  sulfate IVPB 1 g 100 mL (0 g Intravenous Stopped 03/06/22 1221)    ED Course/ Medical Decision Making/ A&P                           Medical Decision Making Risk Prescription drug management.  This patient is a 21 y.o. female  who presents to the ED for concern of migraine headache. Feels similar to prior, no relief with prescriptions medications. Follows with neurology.    Past Medical History / Co-morbidities: Migraines, asthma  Physical Exam: Physical exam performed. The pertinent findings include: Normal vital signs. Normal neurologic exam.   Medications: I ordered medication including migraine cocktail including toradol, reglan, and benadryl as patient states this is what has worked best for her in the past. Also gave magnesium and decadron to prevent migraine recurrence.  I have reviewed the patients home medicines and have made adjustments as needed. Upon reevaluation, patient states her symptoms have almost entirely resolved.    Disposition: After consideration of the diagnostic results and the patients response to treatment, I feel that emergency department workup does not suggest an emergent condition requiring admission or immediate intervention beyond what has been performed at this time. The plan is: discharge to home with continued symptomatic management and encourage neurology follow up to discuss medication management. The patient is safe for discharge and has been instructed to return immediately for worsening symptoms, change in symptoms or any other concerns.   Final Clinical Impression(s) / ED Diagnoses Final diagnoses:  Intractable migraine with aura without status migrainosus    Rx / DC Orders ED Discharge Orders     None      Portions of this report may have been transcribed using voice recognition software. Every effort was made to ensure accuracy; however, inadvertent computerized transcription errors may be present.    Jeanella Flattery 03/06/22  1324    Linwood Dibbles, MD 03/07/22 [phone]

## 2022-03-18 ENCOUNTER — Encounter
Admit: 2022-03-18 | Payer: PRIVATE HEALTH INSURANCE | Attending: Obstetrics and Gynecology | Primary: Adolescent Medicine

## 2022-03-18 DIAGNOSIS — N912 Amenorrhea, unspecified: Secondary | ICD-10-CM

## 2022-03-18 MED ORDER — SLYND 4 MG (28) TABLET
4 mg (28) | ORAL_TABLET | Freq: Every day | ORAL | 1 refills | Status: AC
Start: 2022-03-18 — End: 2022-05-21

## 2022-03-18 NOTE — Telephone Encounter
BLM,Pt requesting a refill on her birth control to be [place].Script set up for review and pharmacy updated.LC

## 2022-03-26 ENCOUNTER — Encounter
Admit: 2022-03-26 | Payer: PRIVATE HEALTH INSURANCE | Attending: Obstetrics and Gynecology | Primary: Adolescent Medicine

## 2022-03-26 ENCOUNTER — Ambulatory Visit: Admit: 2022-03-26 | Payer: BLUE CROSS/BLUE SHIELD | Attending: Obstetrics and Gynecology | Primary: Adolescent Medicine

## 2022-03-26 DIAGNOSIS — N898 Other specified noninflammatory disorders of vagina: Secondary | ICD-10-CM

## 2022-03-26 DIAGNOSIS — R102 Pelvic and perineal pain: Secondary | ICD-10-CM

## 2022-03-27 NOTE — Progress Notes
Subjective:   Leah Lyons is a 21 y.o. female G0 here to discuss pelvic pain.-was having heavy menses and significant cramping-has been on Slynd x several years and skips menses and has minimal cramping-since August 2023 developed pain in groin/vagina and at times pelvic pain-since then has dysparuenia-exercising more and wonders if this has contributed-her mom (my pt) had severe endometriosis-had STD cxs last month at school which were neg-sr at [place]-has significant migraines Gynecologic HistoryNo LMP recorded. (Menstrual status: None to light menses on OCP).Contraception: slynd and condomsLast Pap: 2023. Results were: normalObstetric HistoryOB History Gravida Para Term Preterm AB Living 0 0 0 0 0 0 SAB IAB Ectopic Molar Multiple Live Births 0 0 0 0 0 0 Past  Medical HistoryPast Medical History: Diagnosis Date ? Acne  ? Asthma  ? Headache  ? Migraines  Past Surgical HistoryPast Surgical History: Procedure Laterality Date ? shoulder Right   labrum repair Past Family HistoryFamily History Problem Relation Age of Onset ? Breast cancer Mother  ? Colon cancer Neg Hx  ? Diabetes Neg Hx  ? Heart disease Neg Hx  ? Hypertension Neg Hx  ? Ovarian cancer Neg Hx  ? Stroke Neg Hx  ? Uterine cancer Neg Hx  Past social History Social History Social History Narrative  Leah Lyons is one of three siblings to his divorced parents. She has a twin brother  and an older brother. Her mom is a Risk analyst and father works for [employer]. All immunizations are up-to-date. No allergies of note and Leah Lyons is otherwise a healthy teenager. Mom says there is occasional alcohol use at home only socially. No use of drugs or smoking. Leah Lyons is currently in the 11th grade at [employer]. MedicationsCurrent Outpatient Medications on File Prior to Visit Medication Sig Dispense Refill ? drospirenone, contraceptive, (SLYND) 4 mg (28) Tab Take 4 mg by mouth daily. 84 tablet 0 ? rizatriptan (MAXALT) 10 mg tablet Take 1 tablet (10 mg total) by mouth as needed for migraine. May repeat in 2 hours if needed   ? Nerivio device Start within 60 mins of migraine onset. Set a strong, yet comfortable intensity level in the first few mins. Maintain that level for 45 mins. (Patient not taking: Reported on 03/26/2022) 1 each 12 No current facility-administered medications on file prior to visit.  Objective:  BP 114/80  - Ht 5' 4 (1.626 m)  - Wt 63.5 kg  - BMI 24.03 kg/m? General Appearance:  Alert, cooperative, no distress, appears stated age Head:  Normocephalic, without obvious abnormality, atraumatic Breasts:  deferred Abdomen:   Soft, non-tender,  no masses, no organomegaly Pelvic: exam chaperoned by female assistant, normal vagina and vulva, normal cervix without lesions, polyps or tenderness, uterus normal size, shape, consistency, no mass or tenderness, adnexa normal in size without mass or tenderness    Patient exam or treatment required medical chaperone.The sensitive parts of the examination were performed with chaperone present: Noni Saupe, MA       Assessment:  vaginal/groin pain/dyspareunia Strong fhx of endometriosisOverall doing well on SlyndThe discomfort seems as if it may be more musculoskeletal than abdominopelvic in originWill check TVUS and then decide on planAffirm done

## 2022-03-29 ENCOUNTER — Encounter: Admit: 2022-03-29 | Payer: BLUE CROSS/BLUE SHIELD | Primary: Adolescent Medicine

## 2022-03-29 DIAGNOSIS — R102 Pelvic and perineal pain: Secondary | ICD-10-CM

## 2022-03-29 NOTE — Progress Notes
TVUS for  Pelvic pain per R.Mattea Seger MD:The uterus is A/V and in the midline. No focal masses seen.The endometrium measures 0.3cmNo cysts, masses or free fluid seen in the pelvis.Both ovaries and adnexae are identified and appear normal.No fluid seen in the CDS.Patient will follow up with R.Westly Hinnant MD by phonePatient exam or treatment required medical chaperone.The sensitive parts of the examination were performed with chaperone present: [facility], MAI have reviewed the images and agree with the above findings.

## 2022-04-04 ENCOUNTER — Telehealth
Admit: 2022-04-04 | Payer: PRIVATE HEALTH INSURANCE | Attending: Obstetrics and Gynecology | Primary: Adolescent Medicine

## 2022-04-04 NOTE — Telephone Encounter
Discussed u/s. Feeling better.  Will call me if sxs return

## 2022-05-20 ENCOUNTER — Encounter
Admit: 2022-05-20 | Payer: PRIVATE HEALTH INSURANCE | Attending: Obstetrics and Gynecology | Primary: Adolescent Medicine

## 2022-05-20 DIAGNOSIS — N912 Amenorrhea, unspecified: Secondary | ICD-10-CM

## 2022-05-20 MED ORDER — SLYND 4 MG (28) TABLET
428 mg (28) | ORAL_TABLET | Freq: Every day | ORAL | 2 refills | Status: AC
Start: 2022-05-20 — End: ?

## 2022-05-20 NOTE — Telephone Encounter
BLM,Pt requesting refill on her slynd, due for next annual April 2024.Script set up for your review and pharmacy updated.Thanks,LC

## 2022-10-24 ENCOUNTER — Encounter
Admit: 2022-10-24 | Payer: PRIVATE HEALTH INSURANCE | Attending: Obstetrics and Gynecology | Primary: Adolescent Medicine

## 2022-10-24 DIAGNOSIS — N912 Amenorrhea, unspecified: Secondary | ICD-10-CM

## 2022-10-24 MED ORDER — SLYND 4 MG (28) TABLET
4 mg (28) | ORAL_TABLET | Freq: Every day | ORAL | 1 refills | Status: AC
Start: 2022-10-24 — End: 2022-10-28

## 2022-10-24 NOTE — Telephone Encounter
BLM,Pt has annual 10/28/22 and requesting refill on her birth control, will need to start that day.Script set up and pharmacy confirmedLC

## 2022-10-28 ENCOUNTER — Ambulatory Visit: Admit: 2022-10-28 | Payer: BLUE CROSS/BLUE SHIELD | Attending: Obstetrics and Gynecology | Primary: Adolescent Medicine

## 2022-10-28 ENCOUNTER — Encounter
Admit: 2022-10-28 | Payer: PRIVATE HEALTH INSURANCE | Attending: Obstetrics and Gynecology | Primary: Adolescent Medicine

## 2022-10-28 DIAGNOSIS — L709 Acne, unspecified: Secondary | ICD-10-CM

## 2022-10-28 DIAGNOSIS — N9089 Other specified noninflammatory disorders of vulva and perineum: Secondary | ICD-10-CM

## 2022-10-28 DIAGNOSIS — R519 Headache: Secondary | ICD-10-CM

## 2022-10-28 DIAGNOSIS — Z113 Encounter for screening for infections with a predominantly sexual mode of transmission: Secondary | ICD-10-CM

## 2022-10-28 DIAGNOSIS — J45909 Unspecified asthma, uncomplicated: Secondary | ICD-10-CM

## 2022-10-28 DIAGNOSIS — N912 Amenorrhea, unspecified: Secondary | ICD-10-CM

## 2022-10-28 DIAGNOSIS — G43909 Migraine, unspecified, not intractable, without status migrainosus: Secondary | ICD-10-CM

## 2022-10-28 DIAGNOSIS — Z01419 Encounter for gynecological examination (general) (routine) without abnormal findings: Secondary | ICD-10-CM

## 2022-10-28 MED ORDER — SLYND 4 MG (28) TABLET
428 mg (28) | ORAL_TABLET | Freq: Every day | ORAL | 4 refills | Status: AC
Start: 2022-10-28 — End: ?

## 2022-10-28 MED ORDER — SLYND 4 MG (28) TABLET
428 mg (28) | ORAL_TABLET | Freq: Every day | ORAL | 4 refills | Status: AC
Start: 2022-10-28 — End: 2022-10-28

## 2022-10-28 NOTE — Progress Notes
HGD:JMEQASTM E Leah Lyons is a 22 y.o. G0P0000 female who presents to this practice for an annual exam.Patient's last menstrual period was 10/26/2022. Menses were regular on slynd.  Is taking pills continuously, but finding she is running the rx out early and insurance will not refill, so is rationing the pills at the end of the 3 mo rx (every other day) - having BTB.Not currently sexually active, but has had 2 partners in the last year.Is worried about STD's in general - does not think she has had an exposure, but always worries about it.  Does get ingrown hairs s/p laser from supplemental shaving.Sought 2nd opinion re: cramping 3 mo ago with mom's gyn. Mom has endometriosis; Dr. Meade Leah Lyons did not think she had endo based on exam/TVUS.  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 Past Surgical History: has a past surgical history that includes shoulder (Right).Past Medical History: has a past medical history of Acne, Asthma, Headache, and Migraines.Family History: family history includes Breast cancer in her mother.Allergies:is allergic to latex. Medications: Current Outpatient Medications:   Nerivio, Start within 60 mins of migraine onset. Set a strong, yet comfortable intensity level in the first few mins. Maintain that level for 45 mins.  rizatriptan, 10 mg, Oral, PRN  Slynd, 4 mg, Oral, Daily Social History: reports that she has been smoking. She uses smokeless tobacco. She reports current drug use. Drug: Marijuana. She reports that she does not drink alcohol. Menstrual/Sexual History:as noted; 1 partnerConcerns for Domestic Violence: None OB History Gravida Para Term Preterm AB Living 0 0 0 0 0 0 SAB IAB Ectopic Molar Multiple Live Births 0 0 0 0 0 0 Review of Systems Pt denies the following NEW symptoms:  Items in BOLD are positiveGeneral: Unexplained wt.gain/lossRespiratory: Cough, SOB, wheezeCardiac:  Chest pain, palpitationsBreast:  Breast mass, nipple dischargeGI:  Abdominal pain, constipation, diarrhea, bleedingGYN:  Change in menses, painful intercourse, vaginal dischargeGU:   Urinary incontinencePsych:  Anxiety/depressionEndocrine:  Intolerance to heat/coldNeuro:  Headache, weakness, memory lossChaperone Flowsheet Documentation:Sensitive portions of the physical exam were performed with a chaperone present - please see flow sheet for documentation. Objective: BP 116/70  - Ht 5' 4 (1.626 m)  - Wt 62.1 kg  - LMP 10/26/2022  - BMI 23.52 kg/m?  (137 lbs.)OBGyn ExamGeneral:  Appears well, no distress	Lungs: Good breath sounds; no rales or rhonchi.Thyroid:normal to inspection and palpationHeart: Rhythm regular.  Normal S1 and S2.  No murmurs, gallops, or rubs.	Breast Exam:Normal appearance, no masses or tendernessAbdomen: soft, nontender, no masses palpated, no hepatosplenomegaly	External Genitalia: General appearance; normal2 tiny areas c/w folliculitis left labia maj - NT 	Uterus:Normal size, contour, non-tenderVagina: Normal appearance	Cervix::Cervix has normal appearance			  Adnexa: Normal		Patient consented for cultures: YesHealth Maintenance: PAP:  Completed todayChlamydia & GC screening: Ordered todaySTD Blood Work: N/AAssessment / Plan: Leah Lyons is a 22 y.o. G0P0000 female Annual exam:  Screening as noted above.Patient Education:Specific topics reviewed: contraception: refill slynd- advised she should not ration the rx; should call for sample pack instead or revision of the rx.  Rx sent for continuous dosing and sample x 1 given - is aware contraception is compromised and should use condoms until end of the next pack - and in general for STD prevention.STD check - ordered today.  Viral swab of labial lesions taken - appearance is not suggestive of HSV.Leah Sciara, MD

## 2022-10-30 LAB — SYPHILIS ANTIBODY CASCADING REFLEX     (Q): T. PALLIDUM AB, EIA: NEGATIVE

## 2022-10-30 LAB — HEPATITIS C AB WITH REFLEX TO HCV PCR: HEPATITIS C ANTIBODY (QUEST): NONREACTIVE

## 2022-10-30 LAB — HIV 1/2 AG/AB, W/REFLEXES    (Q): HIV AG/AB, 4TH GENERATION: NONREACTIVE

## 2022-10-30 LAB — HEPATITIS B SURFACE ANTIGEN W/REFL CONFIRM-CHECK     (LMW Q): HEPATITIS B SURFACE ANTIGEN: NONREACTIVE

## 2023-04-25 IMAGING — CR DG SHOULDER 2+V*R*
3 series · 3 of 3 positions shown · non-contrast
Comparison: None.

CLINICAL DATA: Right shoulder pain.

EXAM:
RIGHT SHOULDER - 2+ VIEW

[w shoulder grashey right]
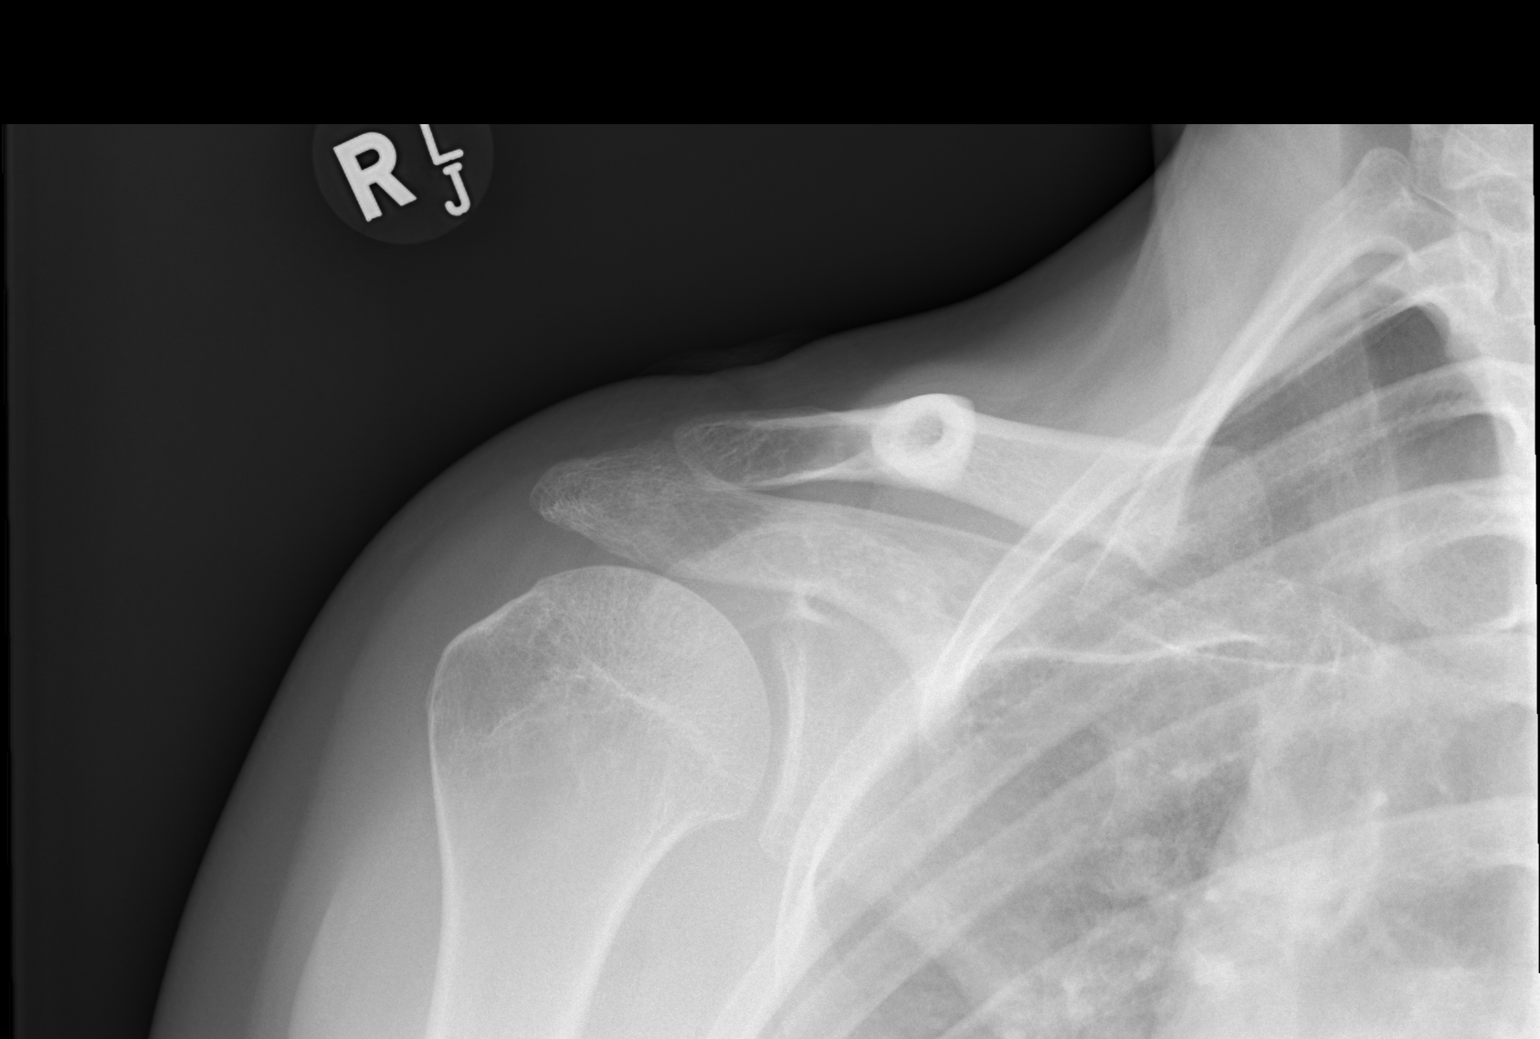

[w shoulder axillary right (1 of 2)]
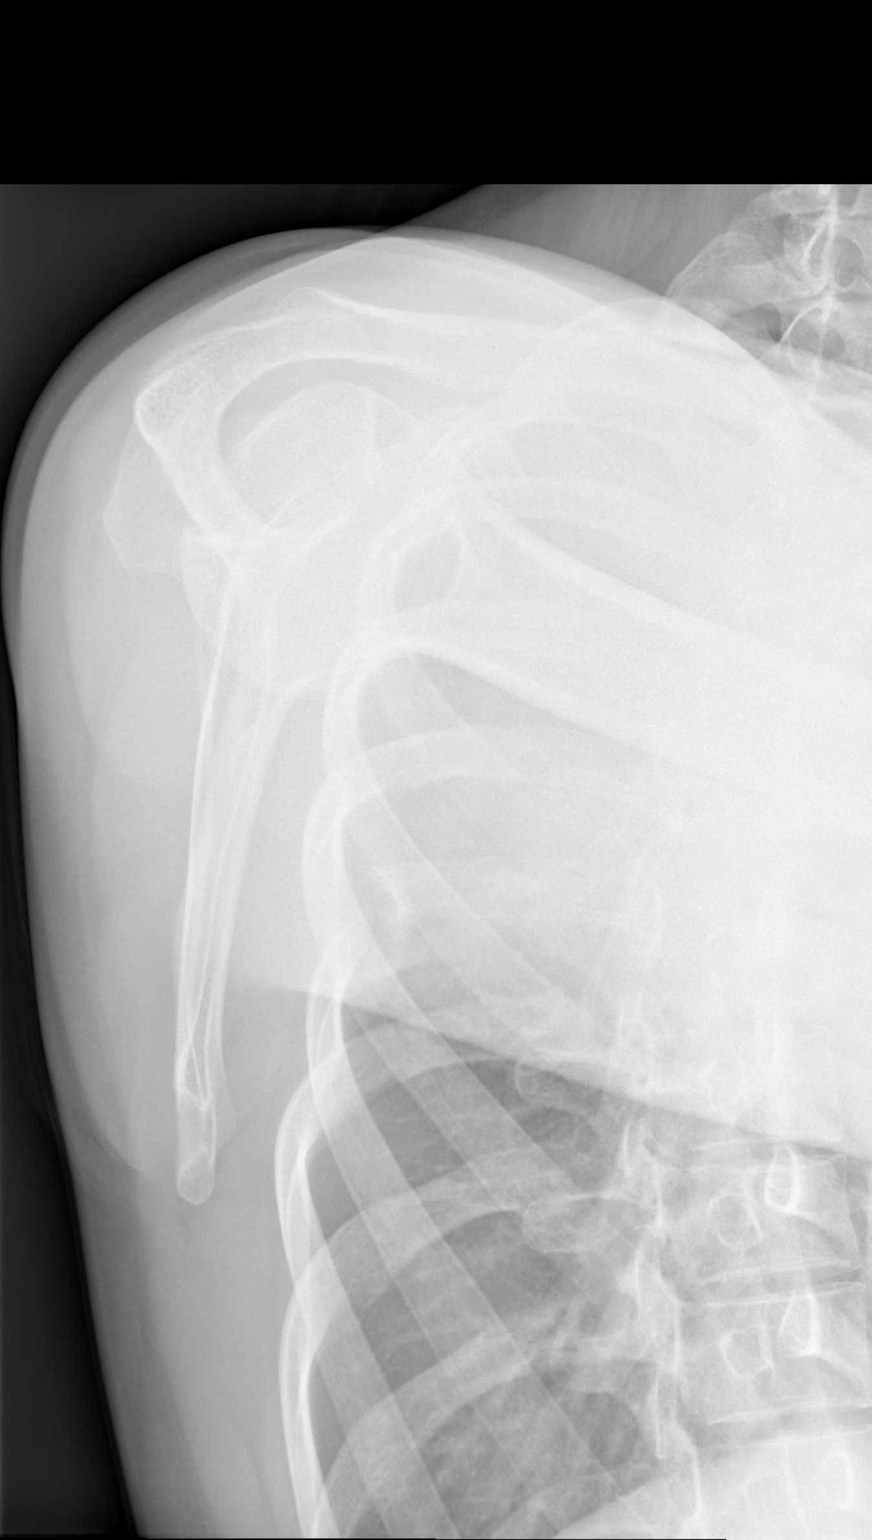

[w shoulder axillary right (2 of 2)]
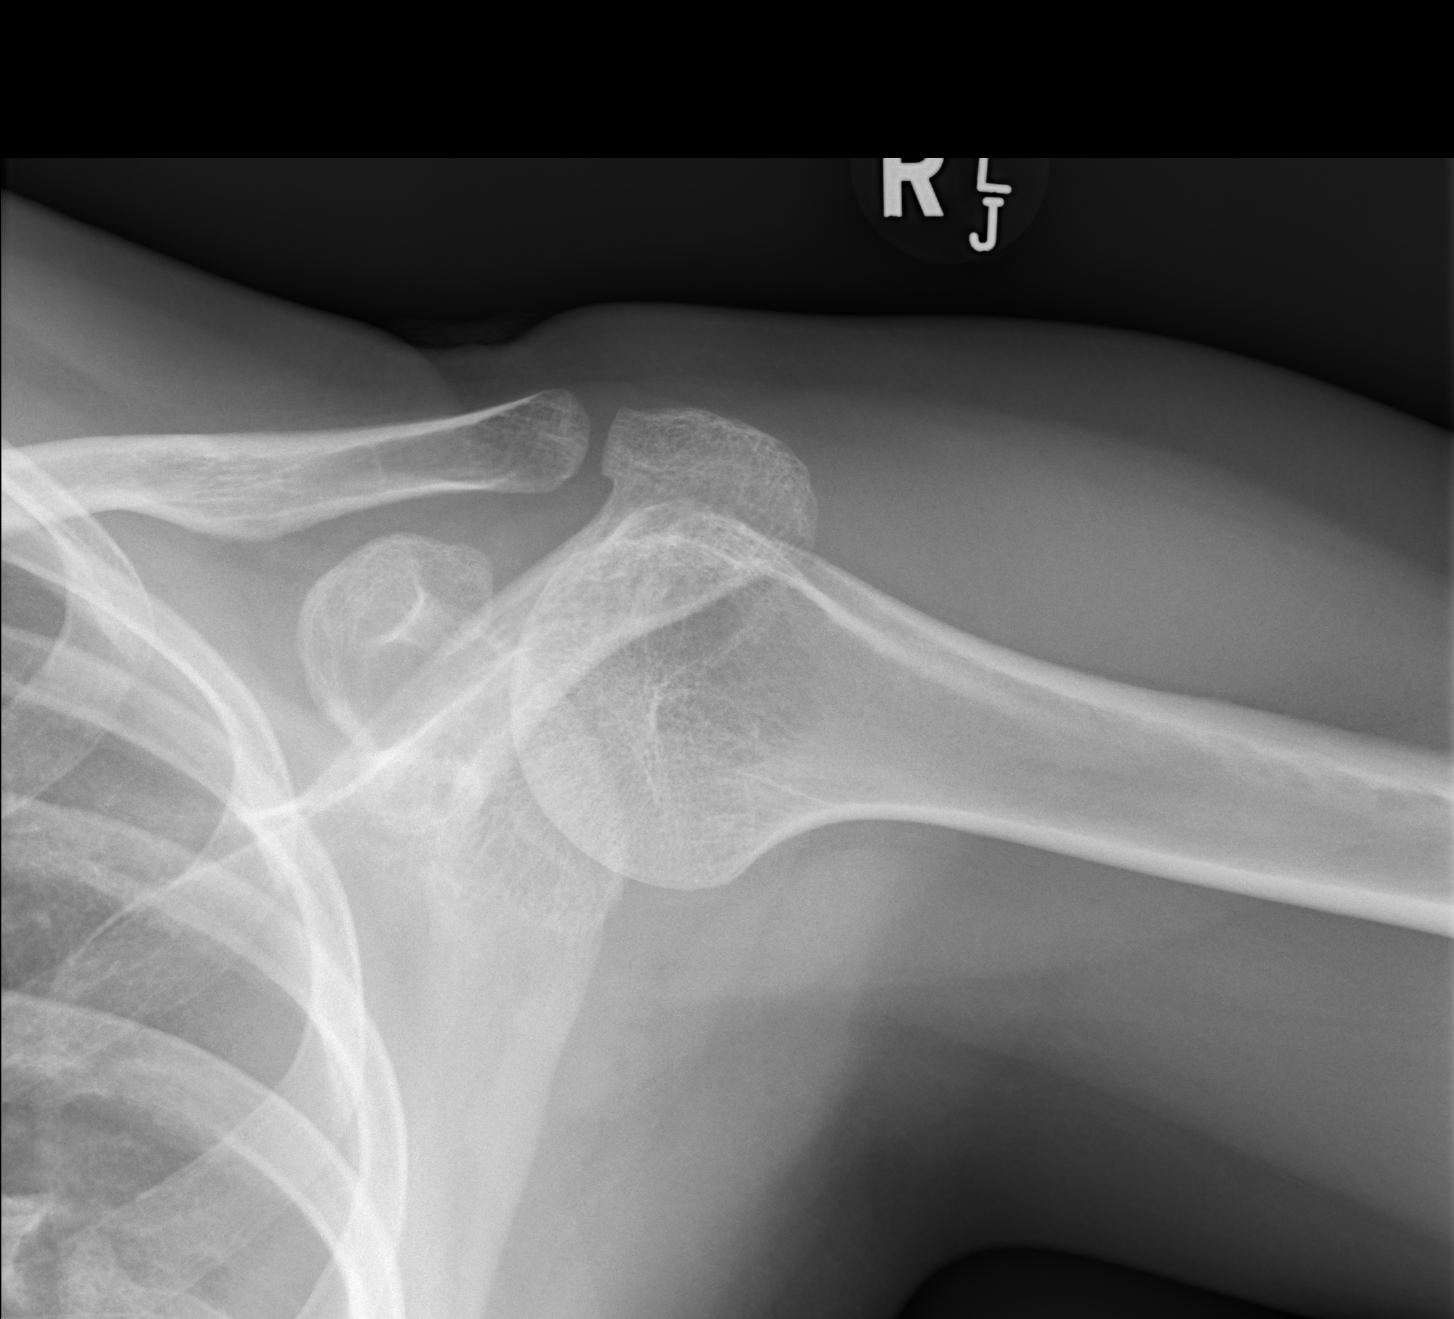

[3 of 3 positions shown; findings below may reference images not displayed]

FINDINGS: There is no evidence of fracture or dislocation. There is no
evidence of arthropathy or other focal bone abnormality. Soft
tissues are unremarkable.
IMPRESSION: Negative.

## 2023-07-28 ENCOUNTER — Encounter
Admit: 2023-07-28 | Payer: PRIVATE HEALTH INSURANCE | Attending: Obstetrics and Gynecology | Primary: Adolescent Medicine

## 2023-07-28 DIAGNOSIS — N912 Amenorrhea, unspecified: Secondary | ICD-10-CM

## 2023-07-28 MED ORDER — SLYND 4 MG (28) TABLET
428 | ORAL_TABLET | Freq: Every day | ORAL | 1 refills | Status: AC
Start: 2023-07-28 — End: ?

## 2023-07-28 NOTE — Telephone Encounter
 BLM,Pt last annual 10/28/22, she has moved to [place] and is not able to see a new gyn until mid to late March.  She is asking for a onetime refill to be sent to a pharmacy local to her.Script set up and pharmacy change, please sign off.LC

## 2023-12-25 ENCOUNTER — Encounter
Admit: 2023-12-25 | Payer: PRIVATE HEALTH INSURANCE | Attending: Obstetrics and Gynecology | Primary: Adolescent Medicine

## 2023-12-25 DIAGNOSIS — N912 Amenorrhea, unspecified: Principal | ICD-10-CM
